# Patient Record
Sex: Female | Born: 1960 | Race: White | Hispanic: No | Marital: Single | Smoking: Former smoker
Health system: Southern US, Community
[De-identification: ages and names within clinical notes are randomized; demographics above are authoritative.]

## PROBLEM LIST (undated history)

## (undated) DIAGNOSIS — F419 Anxiety disorder, unspecified: Secondary | ICD-10-CM

## (undated) DIAGNOSIS — F329 Major depressive disorder, single episode, unspecified: Secondary | ICD-10-CM

## (undated) DIAGNOSIS — M1990 Unspecified osteoarthritis, unspecified site: Secondary | ICD-10-CM

## (undated) DIAGNOSIS — K219 Gastro-esophageal reflux disease without esophagitis: Secondary | ICD-10-CM

## (undated) DIAGNOSIS — F32A Depression, unspecified: Secondary | ICD-10-CM

## (undated) DIAGNOSIS — T4145XA Adverse effect of unspecified anesthetic, initial encounter: Secondary | ICD-10-CM

## (undated) DIAGNOSIS — T8859XA Other complications of anesthesia, initial encounter: Secondary | ICD-10-CM

## (undated) DIAGNOSIS — J45909 Unspecified asthma, uncomplicated: Secondary | ICD-10-CM

## (undated) DIAGNOSIS — R0600 Dyspnea, unspecified: Secondary | ICD-10-CM

## (undated) DIAGNOSIS — R000 Tachycardia, unspecified: Secondary | ICD-10-CM

## (undated) HISTORY — DX: Depression, unspecified: F32.A

## (undated) HISTORY — DX: Unspecified asthma, uncomplicated: J45.909

## (undated) HISTORY — DX: Major depressive disorder, single episode, unspecified: F32.9

## (undated) HISTORY — PX: ABDOMINAL HYSTERECTOMY: SHX81

## (undated) HISTORY — PX: INCONTINENCE SURGERY: SHX676

---

## 1990-08-08 HISTORY — PX: TUBAL LIGATION: SHX77

## 2006-03-29 ENCOUNTER — Ambulatory Visit: Payer: Self-pay | Admitting: Gastroenterology

## 2006-04-03 ENCOUNTER — Ambulatory Visit: Payer: Self-pay | Admitting: Unknown Physician Specialty

## 2007-04-20 ENCOUNTER — Ambulatory Visit: Payer: Self-pay | Admitting: Unknown Physician Specialty

## 2008-06-27 ENCOUNTER — Ambulatory Visit: Payer: Self-pay | Admitting: Unknown Physician Specialty

## 2008-08-21 ENCOUNTER — Ambulatory Visit: Payer: Self-pay | Admitting: Unknown Physician Specialty

## 2008-09-02 ENCOUNTER — Ambulatory Visit: Payer: Self-pay | Admitting: Unknown Physician Specialty

## 2009-07-06 ENCOUNTER — Ambulatory Visit: Payer: Self-pay | Admitting: Unknown Physician Specialty

## 2009-08-08 HISTORY — PX: BLADDER SURGERY: SHX569

## 2009-08-08 HISTORY — PX: HYSTERECTOMY ABDOMINAL WITH SALPINGECTOMY: SHX6725

## 2009-08-28 ENCOUNTER — Ambulatory Visit: Payer: Self-pay | Admitting: Obstetrics & Gynecology

## 2009-09-03 ENCOUNTER — Ambulatory Visit: Payer: Self-pay | Admitting: Obstetrics & Gynecology

## 2010-09-20 ENCOUNTER — Ambulatory Visit: Payer: Self-pay | Admitting: Obstetrics & Gynecology

## 2011-11-15 ENCOUNTER — Ambulatory Visit: Payer: Self-pay

## 2011-11-24 ENCOUNTER — Ambulatory Visit: Payer: Self-pay

## 2013-05-27 ENCOUNTER — Ambulatory Visit (INDEPENDENT_AMBULATORY_CARE_PROVIDER_SITE_OTHER): Payer: BC Managed Care – PPO | Admitting: Podiatry

## 2013-05-27 ENCOUNTER — Encounter: Payer: Self-pay | Admitting: Podiatry

## 2013-05-27 VITALS — BP 107/73 | HR 88 | Resp 16 | Ht 63.0 in | Wt 170.0 lb

## 2013-05-27 DIAGNOSIS — B351 Tinea unguium: Secondary | ICD-10-CM

## 2013-05-27 NOTE — Progress Notes (Signed)
N thick discolored  L right great  D this summer , June O all of sudden  C worse  A  T no treat ment     Salon pedicures .

## 2013-05-27 NOTE — Progress Notes (Signed)
Hannah Smith presents today as a 52 year old white female with concerns of onychomycosis to the hallux nail plates bilateral. She states has been there since the summer she says it appeared all of a sudden they become painful and she's had no treatment.  Objective: Vital signs are stable she is alert and oriented x3 I have reviewed her past medical history medications and allergies. Review of systems unremarkable. Bilateral lower extremity exam reveals strong palpable pulses bilateral. Neurologic Sorum is intact per since once the monofilament. Deep tendon reflexes are intact bilateral brisk and equal symmetrically. Muscle strength is 5 over 5 dorsiflexors plantar flexors inverters and evertors. Orthopedic evaluation should all joints distal to the ankle a full range of motion without crepitus. 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 Cutaneous evaluation does demonstrate thick dystrophic hallux nails bilaterally are thick dystrophic second digital nail plate right. At this point I think that the ridges in the hallux nails given away, in that I feel that this is more than likely a nail dystrophy rather than onychomycosis.  Assessment: Onychodystrophy rule out onychomycosis.  Plan: We performed a nail skin biopsy today from the hallux nails in skin surrounding. We're sending this off for biopsy. I will followup with her once systems in.

## 2013-05-31 ENCOUNTER — Emergency Department: Payer: Self-pay | Admitting: Emergency Medicine

## 2013-05-31 LAB — CBC
HCT: 40.7 % (ref 35.0–47.0)
Platelet: 239 10*3/uL (ref 150–440)
RBC: 4.75 10*6/uL (ref 3.80–5.20)
WBC: 7 10*3/uL (ref 3.6–11.0)

## 2013-05-31 LAB — BASIC METABOLIC PANEL
Anion Gap: 5 — ABNORMAL LOW (ref 7–16)
BUN: 9 mg/dL (ref 7–18)
Calcium, Total: 9.5 mg/dL (ref 8.5–10.1)
Co2: 30 mmol/L (ref 21–32)
Creatinine: 0.86 mg/dL (ref 0.60–1.30)
EGFR (African American): >60
EGFR (Non-African Amer.): >60
Osmolality: 276 (ref 275–301)
Potassium: 3.8 mmol/L (ref 3.5–5.1)
Sodium: 139 mmol/L (ref 136–145)

## 2013-06-14 ENCOUNTER — Telehealth: Payer: Self-pay | Admitting: Podiatry

## 2013-06-14 ENCOUNTER — Encounter: Payer: Self-pay | Admitting: Podiatry

## 2013-06-14 NOTE — Telephone Encounter (Signed)
Left message for patient to schedule an appointment for lab results.

## 2013-07-11 ENCOUNTER — Ambulatory Visit: Payer: BC Managed Care – PPO | Admitting: Podiatry

## 2013-12-05 ENCOUNTER — Ambulatory Visit: Payer: Self-pay | Admitting: Gastroenterology

## 2013-12-06 LAB — PATHOLOGY REPORT

## 2014-03-12 ENCOUNTER — Telehealth: Payer: Self-pay | Admitting: *Deleted

## 2014-03-12 NOTE — Telephone Encounter (Signed)
Patient called and stated that she never got a call regarding her lab results, i called patient back and told her i did leave her a message in November regarding her results she was to schedule an appointment for treatment . Patient understood and will call back to schedule, she cant afford the copay at this time

## 2014-05-26 ENCOUNTER — Telehealth: Payer: Self-pay | Admitting: *Deleted

## 2014-05-26 NOTE — Telephone Encounter (Signed)
Patient called asking if we could send over her results from her toenail fungus to her primary doctor, she does not have health insurance at this time . Will send results to dr Welton Flakeskhan

## 2015-03-05 ENCOUNTER — Ambulatory Visit: Payer: Self-pay | Admitting: Dietician

## 2015-03-24 ENCOUNTER — Encounter: Payer: 59 | Attending: Dietician | Admitting: Dietician

## 2015-03-24 ENCOUNTER — Encounter: Payer: Self-pay | Admitting: Dietician

## 2015-03-24 DIAGNOSIS — Z713 Dietary counseling and surveillance: Secondary | ICD-10-CM | POA: Diagnosis not present

## 2015-03-24 NOTE — Progress Notes (Signed)
Notes from [facility] employee "self referral" nutrition session: Start time: 13:15   End time: 14:00  Met with employee to discuss his/her nutritional concerns and diet history. The employee's questions/concerns were also addressed.  We discussed the following topics:  Healthy Eating  Weight Concerns  I also provided the following handouts as reinforcement of the educational session:  Planning a Balanced Meal  Sample menus and/or recipes   Additional Comments: Patient has made positive diet changes in the past 10 months and has lost approximately 10 lbs.She has increased her intake of fruits/vegetables, and fiber. Reviewed a meal plan and menus based on 1300 calories showing how to balance carbohydrate servings, protein and non-starchy vegetables.  Goals Agreed Upon: 1. Patient to spread 8 servings of carbohydrate over 3 meals and 1-2 small snacks.  2.To balance meals with 2-3 servings of carbohydrate, 1-3 oz.of lean protein.   3. To measure some portions especially of starchy foods.

## 2015-03-24 NOTE — Patient Instructions (Signed)
Goals Agreed Upon: 1. Patient to spread 8 servings of carbohydrate over 3 meals and 1-2 small snacks.  2.To balance meals with 2-3 servings of carbohydrate, 1-3 oz.of lean protein.   3. To measure some portions especially of starchy foods.

## 2015-08-14 DIAGNOSIS — J069 Acute upper respiratory infection, unspecified: Secondary | ICD-10-CM | POA: Diagnosis not present

## 2015-08-14 DIAGNOSIS — K219 Gastro-esophageal reflux disease without esophagitis: Secondary | ICD-10-CM | POA: Diagnosis not present

## 2015-08-14 DIAGNOSIS — J309 Allergic rhinitis, unspecified: Secondary | ICD-10-CM | POA: Diagnosis not present

## 2015-08-14 DIAGNOSIS — F411 Generalized anxiety disorder: Secondary | ICD-10-CM | POA: Diagnosis not present

## 2015-08-14 DIAGNOSIS — M7052 Other bursitis of knee, left knee: Secondary | ICD-10-CM | POA: Diagnosis not present

## 2015-09-22 DIAGNOSIS — M7052 Other bursitis of knee, left knee: Secondary | ICD-10-CM | POA: Diagnosis not present

## 2015-12-11 DIAGNOSIS — M545 Low back pain: Secondary | ICD-10-CM | POA: Diagnosis not present

## 2015-12-14 DIAGNOSIS — M5135 Other intervertebral disc degeneration, thoracolumbar region: Secondary | ICD-10-CM | POA: Diagnosis not present

## 2015-12-14 DIAGNOSIS — M5489 Other dorsalgia: Secondary | ICD-10-CM | POA: Diagnosis not present

## 2015-12-14 DIAGNOSIS — F411 Generalized anxiety disorder: Secondary | ICD-10-CM | POA: Diagnosis not present

## 2015-12-17 ENCOUNTER — Other Ambulatory Visit: Payer: Self-pay | Admitting: Internal Medicine

## 2015-12-17 DIAGNOSIS — M546 Pain in thoracic spine: Secondary | ICD-10-CM

## 2015-12-17 DIAGNOSIS — M549 Dorsalgia, unspecified: Secondary | ICD-10-CM

## 2015-12-28 DIAGNOSIS — R300 Dysuria: Secondary | ICD-10-CM | POA: Diagnosis not present

## 2015-12-28 DIAGNOSIS — F329 Major depressive disorder, single episode, unspecified: Secondary | ICD-10-CM | POA: Diagnosis not present

## 2015-12-31 DIAGNOSIS — M545 Low back pain: Secondary | ICD-10-CM | POA: Diagnosis not present

## 2016-01-12 ENCOUNTER — Other Ambulatory Visit: Payer: 59

## 2016-01-12 ENCOUNTER — Ambulatory Visit: Payer: 59

## 2016-03-07 DIAGNOSIS — F411 Generalized anxiety disorder: Secondary | ICD-10-CM | POA: Diagnosis not present

## 2016-04-07 DIAGNOSIS — Z0000 Encounter for general adult medical examination without abnormal findings: Secondary | ICD-10-CM | POA: Diagnosis not present

## 2016-04-07 DIAGNOSIS — K219 Gastro-esophageal reflux disease without esophagitis: Secondary | ICD-10-CM | POA: Diagnosis not present

## 2016-04-07 DIAGNOSIS — Z124 Encounter for screening for malignant neoplasm of cervix: Secondary | ICD-10-CM | POA: Diagnosis not present

## 2016-04-07 DIAGNOSIS — F411 Generalized anxiety disorder: Secondary | ICD-10-CM | POA: Diagnosis not present

## 2016-04-07 DIAGNOSIS — R0602 Shortness of breath: Secondary | ICD-10-CM | POA: Diagnosis not present

## 2016-04-07 DIAGNOSIS — J4520 Mild intermittent asthma, uncomplicated: Secondary | ICD-10-CM | POA: Diagnosis not present

## 2016-04-12 ENCOUNTER — Other Ambulatory Visit: Payer: Self-pay | Admitting: Internal Medicine

## 2016-04-12 DIAGNOSIS — Z1231 Encounter for screening mammogram for malignant neoplasm of breast: Secondary | ICD-10-CM

## 2016-04-26 ENCOUNTER — Ambulatory Visit
Admission: RE | Admit: 2016-04-26 | Discharge: 2016-04-26 | Disposition: A | Discharge status: Home / Self Care | Payer: 59 | Source: Ambulatory Visit | Attending: Internal Medicine | Admitting: Internal Medicine

## 2016-04-26 DIAGNOSIS — Z1231 Encounter for screening mammogram for malignant neoplasm of breast: Secondary | ICD-10-CM | POA: Diagnosis not present

## 2016-04-26 DIAGNOSIS — R29890 Loss of height: Secondary | ICD-10-CM | POA: Diagnosis not present

## 2016-04-26 DIAGNOSIS — N951 Menopausal and female climacteric states: Secondary | ICD-10-CM | POA: Diagnosis not present

## 2016-04-26 DIAGNOSIS — R0602 Shortness of breath: Secondary | ICD-10-CM | POA: Diagnosis not present

## 2016-04-28 ENCOUNTER — Ambulatory Visit: Payer: 59

## 2016-05-02 DIAGNOSIS — J309 Allergic rhinitis, unspecified: Secondary | ICD-10-CM | POA: Diagnosis not present

## 2016-05-02 DIAGNOSIS — I313 Pericardial effusion (noninflammatory): Secondary | ICD-10-CM | POA: Diagnosis not present

## 2016-05-02 DIAGNOSIS — J069 Acute upper respiratory infection, unspecified: Secondary | ICD-10-CM | POA: Diagnosis not present

## 2016-05-02 DIAGNOSIS — J45998 Other asthma: Secondary | ICD-10-CM | POA: Diagnosis not present

## 2016-05-02 DIAGNOSIS — R9431 Abnormal electrocardiogram [ECG] [EKG]: Secondary | ICD-10-CM | POA: Diagnosis not present

## 2016-05-02 DIAGNOSIS — R0602 Shortness of breath: Secondary | ICD-10-CM | POA: Diagnosis not present

## 2016-05-02 DIAGNOSIS — R0782 Intercostal pain: Secondary | ICD-10-CM | POA: Diagnosis not present

## 2016-05-09 DIAGNOSIS — R079 Chest pain, unspecified: Secondary | ICD-10-CM | POA: Diagnosis not present

## 2016-05-12 DIAGNOSIS — R5383 Other fatigue: Secondary | ICD-10-CM | POA: Diagnosis not present

## 2016-05-12 DIAGNOSIS — R9431 Abnormal electrocardiogram [ECG] [EKG]: Secondary | ICD-10-CM | POA: Diagnosis not present

## 2016-05-26 DIAGNOSIS — R0782 Intercostal pain: Secondary | ICD-10-CM | POA: Diagnosis not present

## 2016-05-26 DIAGNOSIS — R002 Palpitations: Secondary | ICD-10-CM | POA: Diagnosis not present

## 2016-05-26 DIAGNOSIS — R0602 Shortness of breath: Secondary | ICD-10-CM | POA: Diagnosis not present

## 2016-06-01 ENCOUNTER — Emergency Department: Payer: 59

## 2016-06-01 ENCOUNTER — Emergency Department
Admission: EM | Admit: 2016-06-01 | Discharge: 2016-06-02 | Disposition: A | Discharge status: Home / Self Care | Payer: 59 | Attending: Emergency Medicine | Admitting: Emergency Medicine

## 2016-06-01 ENCOUNTER — Encounter: Payer: Self-pay | Admitting: Emergency Medicine

## 2016-06-01 DIAGNOSIS — Z7982 Long term (current) use of aspirin: Secondary | ICD-10-CM | POA: Diagnosis not present

## 2016-06-01 DIAGNOSIS — J45909 Unspecified asthma, uncomplicated: Secondary | ICD-10-CM | POA: Diagnosis not present

## 2016-06-01 DIAGNOSIS — Z79899 Other long term (current) drug therapy: Secondary | ICD-10-CM | POA: Insufficient documentation

## 2016-06-01 DIAGNOSIS — Z91040 Latex allergy status: Secondary | ICD-10-CM | POA: Diagnosis not present

## 2016-06-01 DIAGNOSIS — K8020 Calculus of gallbladder without cholecystitis without obstruction: Secondary | ICD-10-CM | POA: Diagnosis not present

## 2016-06-01 DIAGNOSIS — R1011 Right upper quadrant pain: Secondary | ICD-10-CM | POA: Diagnosis not present

## 2016-06-01 DIAGNOSIS — R110 Nausea: Secondary | ICD-10-CM

## 2016-06-01 DIAGNOSIS — Z87891 Personal history of nicotine dependence: Secondary | ICD-10-CM | POA: Insufficient documentation

## 2016-06-01 HISTORY — DX: Unspecified osteoarthritis, unspecified site: M19.90

## 2016-06-01 HISTORY — DX: Tachycardia, unspecified: R00.0

## 2016-06-01 LAB — COMPREHENSIVE METABOLIC PANEL
ALBUMIN: 3.9 g/dL (ref 3.5–5.0)
ALK PHOS: 64 U/L (ref 38–126)
ALT: 29 U/L (ref 14–54)
ANION GAP: 8 (ref 5–15)
AST: 23 U/L (ref 15–41)
BILIRUBIN TOTAL: 0.3 mg/dL (ref 0.3–1.2)
BUN: 15 mg/dL (ref 6–20)
CALCIUM: 9.2 mg/dL (ref 8.9–10.3)
CO2: 26 mmol/L (ref 22–32)
Chloride: 103 mmol/L (ref 101–111)
Creatinine, Ser: 0.9 mg/dL (ref 0.44–1.00)
GFR calc Af Amer: >60 mL/min (ref >60)
GLUCOSE: 99 mg/dL (ref 65–99)
Potassium: 3.9 mmol/L (ref 3.5–5.1)
Sodium: 137 mmol/L (ref 135–145)
TOTAL PROTEIN: 7.2 g/dL (ref 6.5–8.1)

## 2016-06-01 LAB — CBC
HCT: 41 % (ref 35.0–47.0)
HEMOGLOBIN: 13.7 g/dL (ref 12.0–16.0)
MCH: 28.6 pg (ref 26.0–34.0)
MCHC: 33.5 g/dL (ref 32.0–36.0)
MCV: 85.4 fL (ref 80.0–100.0)
Platelets: 248 10*3/uL (ref 150–440)
RBC: 4.79 MIL/uL (ref 3.80–5.20)
RDW: 13.4 % (ref 11.5–14.5)
WBC: 9.3 10*3/uL (ref 3.6–11.0)

## 2016-06-01 LAB — URINALYSIS COMPLETE WITH MICROSCOPIC (ARMC ONLY)
Bacteria, UA: NONE SEEN
Bilirubin Urine: NEGATIVE
GLUCOSE, UA: NEGATIVE mg/dL
KETONES UR: NEGATIVE mg/dL
Leukocytes, UA: NEGATIVE
NITRITE: NEGATIVE
Protein, ur: NEGATIVE mg/dL
RBC / HPF: NONE SEEN RBC/hpf (ref 0–5)
SPECIFIC GRAVITY, URINE: 1.01 (ref 1.005–1.030)
Squamous Epithelial / LPF: NONE SEEN
pH: 6 (ref 5.0–8.0)

## 2016-06-01 LAB — LIPASE, BLOOD: LIPASE: 41 U/L (ref 11–51)

## 2016-06-01 MED ORDER — ONDANSETRON HCL 4 MG/2ML IJ SOLN
4.0000 mg | Freq: Once | INTRAMUSCULAR | Status: AC
  Administered 2016-06-01: 4 mg via INTRAVENOUS
  Filled 2016-06-01: qty 2

## 2016-06-01 MED ORDER — OXYCODONE-ACETAMINOPHEN 5-325 MG PO TABS: 1.0000 | ORAL_TABLET | Freq: Four times a day (QID) | ORAL | 0 refills | Status: DC | PRN

## 2016-06-01 MED ORDER — ONDANSETRON 4 MG PO TBDP: 4.0000 mg | ORAL_TABLET | Freq: Three times a day (TID) | ORAL | 0 refills | Status: DC | PRN

## 2016-06-01 MED ORDER — SODIUM CHLORIDE 0.9 % IV BOLUS (SEPSIS)
1000.0000 mL | Freq: Once | INTRAVENOUS | Status: AC
  Administered 2016-06-01: 1000 mL via INTRAVENOUS

## 2016-06-01 MED ORDER — ACETAMINOPHEN 500 MG PO TABS
1000.0000 mg | ORAL_TABLET | Freq: Once | ORAL | Status: AC
  Administered 2016-06-01: 1000 mg via ORAL
  Filled 2016-06-01: qty 2

## 2016-06-01 NOTE — ED Provider Notes (Signed)
[facility] Emergency Department Provider Note  ____________________________________________  Time seen: Approximately 9:52 PM  I have reviewed the triage vital signs and the nursing notes.   HISTORY  Chief Complaint Abdominal Pain    HPI Hannah Smith is a 55 y.o. female s/p abdominal hysterectomy and incontinence surgery presenting with right upper quadrant pain, nausea without vomiting. The patient reports that last night after dinner she developed a dull ache in the right upper quadrant. This point she had some mild nausea which resolved. This evening, immediately after eating dinner, she developed acute intermittent episodes of worsening severe cramping "like labor" in the right upper quadrant. She denies any nausea or vomiting tonight, diarrhea, urinary symptoms, fever.She tried Tums, Pepto-Bismol, and ginger ale without improvement.   Past Medical History:  Diagnosis Date  . Arthritis    bilateral hands  . Asthma   . Depression   . Tachycardia     There are no active problems to display for this patient.   Past Surgical History:  Procedure Laterality Date  . ABDOMINAL HYSTERECTOMY    . INCONTINENCE SURGERY      Current Outpatient Rx  . Order #: [id number] Class: Historical Med  . Order #: [id number] Class: Historical Med  . Order #: [id number] Class: Historical Med  . Order #: [id number] Class: Historical Med  . Order #: [id number] Class: Historical Med  . Order #: [id number] Class: Historical Med  . Order #: [id number] Class: Historical Med  . Order #: [id number] Class: Historical Med  . Order #: [id number] Class: Print  . Order #: [id number] Class: Print    Allergies Latex  No family history on file.  Social History Social History  Substance Use Topics  . Smoking status: Former Games developer  . Smokeless tobacco: Never Used     Comment: quit 5 years ago  . Alcohol use No    Review of Systems Constitutional: No fever/chills.No lightheadedness or  fainting. Eyes: No visual changes. ENT: No sore throat. No congestion or rhinorrhea. Cardiovascular: Denies chest pain. Denies palpitations. Respiratory: Denies shortness of breath.  No cough. Gastrointestinal: Positive right upper quadrant abdominal pain.  Positive nausea, no vomiting.  No diarrhea.  No constipation. Genitourinary: Negative for dysuria. Musculoskeletal: Negative for back pain. Skin: Negative for rash. Neurological: Negative for headaches. No focal numbness, tingling or weakness.   10-point ROS otherwise negative.  ____________________________________________   PHYSICAL EXAM:  VITAL SIGNS: ED Triage Vitals [06/01/16 2100]  Enc Vitals Group     BP (!) 142/86     Pulse Rate 92     Resp 18     Temp 97.9 F (36.6 C)     Temp Source Oral     SpO2 97 %     Weight 167 lb (75.8 kg)     Height 5\' 3"  (1.6 m)     Head Circumference      Peak Flow      Pain Score 6     Pain Loc      Pain Edu?      Excl. in GC?     Constitutional: Alert and oriented. Well appearing and in no acute distress. Answers questions appropriately. Eyes: Conjunctivae are normal.  EOMI. No scleral icterus. Head: Atraumatic. Nose: No congestion/rhinnorhea. Mouth/Throat: Mucous membranes are moist.  Neck: No stridor.  Supple.  No meningismus. Cardiovascular: Normal rate, regular rhythm. No murmurs, rubs or gallops.  Respiratory: Normal respiratory effort.  No accessory muscle use or retractions. Lungs CTAB.  No wheezes, rales or ronchi. Gastrointestinal: Overweight. Soft, and  nondistended. Positive tenderness to palpation in the right upper quadrant without Murphy sign. No guarding or rebound.  No peritoneal signs. Musculoskeletal: No LE edema.  Neurologic:  A&Ox3.  Speech is clear.  Face and smile are symmetric.  EOMI.  Moves all extremities well. Skin:  Skin is warm, dry and intact. No rash noted. Psychiatric: Mood and affect are normal. Speech and behavior are normal.  Normal  judgement.  ____________________________________________   LABS (all labs ordered are listed, but only abnormal results are displayed)  Labs Reviewed  URINALYSIS COMPLETEWITH MICROSCOPIC ([facility] ONLY) - Abnormal; Notable for the following:       Result Value   Color, Urine STRAW (*)    APPearance CLEAR (*)    Hgb urine dipstick 1+ (*)    All other components within normal limits  LIPASE, BLOOD  COMPREHENSIVE METABOLIC PANEL  CBC   ____________________________________________  EKG  ED ECG REPORT I, Rockne Menghini, the attending physician, personally viewed and interpreted this ECG.   Date: 06/01/2016  EKG Time: 2113  Rate: 81  Rhythm: normal sinus rhythm  Axis: normal  Intervals:none  ST&T Change: No STEMI.  ____________________________________________  RADIOLOGY  US Abdomen Limited Ruq  Result Date: 06/01/2016 CLINICAL DATA:  Right upper quadrant pain EXAM: US ABDOMEN LIMITED - RIGHT UPPER QUADRANT COMPARISON:  None. FINDINGS: Gallbladder: The gallbladder is distended in appearance with several calculi along the dependent wall. The largest is seen near the neck of the gallbladder measuring approximately 9 mm. Sonographic Murphy's sign was noted by the technologist. No wall thickening or pericholecystic fluid. Common bile duct: Diameter: 4 mm Liver: No focal lesion identified. Within normal limits in parenchymal echogenicity. IMPRESSION: Distended gallbladder with gallstones and positive sonographic Murphy's sign. Acute cholecystitis is suspected. Electronically Signed   By: Tollie Eth M.D.   On: 06/01/2016 23:40    ____________________________________________   PROCEDURES  Procedure(s) performed: None  Procedures  Critical Care performed: No ____________________________________________   INITIAL IMPRESSION / ASSESSMENT AND PLAN / ED COURSE  Pertinent labs & imaging results that were available during my care of the patient were reviewed by me and  considered in my medical decision making (see chart for details).  55 y.o. female presenting with right upper quadrant pain that is worse with eating, associated nausea without vomiting. Overall, the patient is well-appearing comfortable, and afebrile. She does have some right upper quadrant tenderness. I am concerned about gallbladder disease, I would also consider GERD, peptic ulcer disease or gastric ulcer disease. Her history and physical examination are not consistent with cardiac pathology [facility] pathology. Her EKG does not show any ischemic changes. We'll get basic labs, and ultrasound of the right upper quadrant, and initiated symptomatically treatment.  ----------------------------------------- 11:50 PM on 06/01/2016 -----------------------------------------  The patient is feeling much better at this time. She continues to be hemodynamically stable and afebrile. She does not have an elevation in her white blood cell count and her LFTs are normal. Her ultrasound shows gallstones without thickening of the duct, pericholecystic fluid or thickening of the gallbladder wall. I had Dr. Tonita Cong reviewed the patient's laboratory and ultrasonographic findings, and we both agree that the patient does not meet criteria for cholecystitis or acute emergent cholecystectomy. She will follow up as an outpatient with Dr. Tonita Cong. I will plan to discharge her home with symptom at a treatment, dietary recommendations, and follow-up. Return precautions were discussed. ____________________________________________  FINAL CLINICAL IMPRESSION(S) / ED DIAGNOSES  Final diagnoses:  Right upper quadrant pain  Gallstones  Nausea without vomiting    Clinical Course      NEW MEDICATIONS STARTED DURING THIS VISIT:  New Prescriptions   ONDANSETRON (ZOFRAN ODT) 4 MG DISINTEGRATING TABLET    Take 1 tablet (4 mg total) by mouth every 8 (eight) hours as needed for nausea or vomiting.   OXYCODONE-ACETAMINOPHEN  (ROXICET) 5-325 MG TABLET    Take 1 tablet by mouth every 6 (six) hours as needed.      Rockne MenghiniAnne-Caroline Krystal Teachey, MD 06/01/16 2351

## 2016-06-01 NOTE — ED Notes (Signed)
Patient transported to Ultrasound 

## 2016-06-01 NOTE — Discharge Instructions (Signed)
Please follow the dietary recommendations for gallbladder conditions. You may take Zofran for nausea and Percocet for severe pain. Please make an appointment with Dr. Tonita CongWoodham, the general surgeon, for evaluation of your gallbladder pain.  Return to the emergency department if you develop severe pain, fever, inability to keep down fluids, or any other symptoms concerning to you.

## 2016-06-01 NOTE — ED Triage Notes (Signed)
Pt ambulatory to triage with steady gait with c/o RUQ accompanied by nasuea since last night after eating dinner and tonight again after eating dinner. Pt reports feeling gas pains. Pt denies diarrhea, vomiting, or chest pain. Pt alert and oriented x 4.

## 2016-06-02 ENCOUNTER — Other Ambulatory Visit: Payer: Self-pay

## 2016-06-02 DIAGNOSIS — F32A Depression, unspecified: Secondary | ICD-10-CM | POA: Insufficient documentation

## 2016-06-02 DIAGNOSIS — M1990 Unspecified osteoarthritis, unspecified site: Secondary | ICD-10-CM | POA: Insufficient documentation

## 2016-06-02 DIAGNOSIS — F329 Major depressive disorder, single episode, unspecified: Secondary | ICD-10-CM | POA: Insufficient documentation

## 2016-06-02 DIAGNOSIS — T7840XA Allergy, unspecified, initial encounter: Secondary | ICD-10-CM | POA: Insufficient documentation

## 2016-06-02 DIAGNOSIS — IMO0002 Reserved for concepts with insufficient information to code with codable children: Secondary | ICD-10-CM | POA: Insufficient documentation

## 2016-06-08 ENCOUNTER — Ambulatory Visit (INDEPENDENT_AMBULATORY_CARE_PROVIDER_SITE_OTHER): Payer: 59 | Admitting: General Surgery

## 2016-06-08 ENCOUNTER — Encounter: Payer: Self-pay | Admitting: General Surgery

## 2016-06-08 VITALS — BP 121/82 | HR 77 | Temp 98.0°F | Ht 63.0 in | Wt 172.0 lb

## 2016-06-08 DIAGNOSIS — K8020 Calculus of gallbladder without cholecystitis without obstruction: Secondary | ICD-10-CM | POA: Diagnosis not present

## 2016-06-08 NOTE — Patient Instructions (Addendum)
  We have your surgery scheduled for 06/24/16 with Dr.Woodham at [facility]. Please see [facility] pre-care sheet for surgery information. Please call our office if you have any questions or concerns.

## 2016-06-08 NOTE — Progress Notes (Signed)
Patient ID: Hannah Smith, female   DOB: [birth year], 55 y.o.   MRN: [id number]  CC: RUQ PAIN  HPI Hannah FellingJennifer A Smith is a 55 y.o. female who presents to clinic today for evaluation of right upper quadrant pain. Patient states he was seen in the ER last week and was diagnosed with gallstones. Last week which her first ever attack. She reports since then she has had some persistent discomfort in the right upper quadrant but not as bad as that time. She has had nausea with the attacks but no vomiting. She also states that after tach she's had some loose stools and some belching but denies any fevers, chills, chest pain, shortness of breath, vomiting, constipation. When she has the pain she says it is a sharp pain in her right upper quadrant but does not shoot through her. She also developed headaches with it. She has had a decrease in appetite secondary to the pain because she is afraid to eat thinking that it might cause the pain. She also has a history of depression and is currently on medications for such. She is otherwise in her usual state of health. 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  HPI  Past Medical History:  Diagnosis Date  . Arthritis    bilateral hands  . Asthma   . Depression   . Tachycardia     Past Surgical History:  Procedure Laterality Date  . ABDOMINAL HYSTERECTOMY    . BLADDER SURGERY  2011  . HYSTERECTOMY ABDOMINAL WITH SALPINGECTOMY  2011  . INCONTINENCE SURGERY    . TUBAL LIGATION  1992    Family History  Problem Relation Age of Onset  . Hypertension Mother   . Stroke Mother     Social History Social History  Substance Use Topics  . Smoking status: Former Games developermoker  . Smokeless tobacco: Never Used     Comment: quit 5 years ago  . Alcohol use No    Allergies  Allergen Reactions  . Latex Shortness Of Breath    Current Outpatient Prescriptions  Medication Sig Dispense Refill  . albuterol (PROVENTIL) (5 MG/ML) 0.5% nebulizer solution Take  2.5 mg by nebulization as needed for wheezing.    . citalopram (CELEXA) 40 MG tablet Take 40 mg by mouth daily.    . cyclobenzaprine (FLEXERIL) 5 MG tablet Take 10 mg by mouth at bedtime.     . DULoxetine (CYMBALTA) 30 MG capsule Take 30 mg by mouth daily.    Marland Kitchen. esomeprazole (NEXIUM) 40 MG capsule Take 40 mg by mouth daily at 12 noon.    . fluticasone (FLONASE) 50 MCG/ACT nasal spray by Nasal route.    . fluticasone-salmeterol (ADVAIR HFA) 230-21 MCG/ACT inhaler Inhale 2 puffs into the lungs as needed.    . meloxicam (MOBIC) 15 MG tablet Take by mouth.    . metoprolol-hydrochlorothiazide (LOPRESSOR HCT) 100-25 MG tablet Take 1 tablet by mouth daily.    . ondansetron (ZOFRAN ODT) 4 MG disintegrating tablet Take 1 tablet (4 mg total) by mouth every 8 (eight) hours as needed for nausea or vomiting. 20 tablet 0  . oxyCODONE-acetaminophen (ROXICET) 5-325 MG tablet Take 1 tablet by mouth every 6 (six) hours as needed. 12 tablet 0   No current facility-administered medications for this visit.      Review of Systems A Multi-point review of systems was asked and was negative except for the findings documented in the history of present illness  Physical Exam Blood pressure  121/82, pulse 77, temperature 98 F (36.7 C), temperature source Oral, height 5\' 3"  (1.6 m), weight 78 kg (172 lb). CONSTITUTIONAL: No acute distress. EYES: Pupils are equal, round, and reactive to light, Sclera are non-icteric. EARS, NOSE, MOUTH AND THROAT: The oropharynx is clear. The oral mucosa is pink and moist. Hearing is intact to voice. LYMPH NODES:  Lymph nodes in the neck are normal. RESPIRATORY:  Lungs are clear. There is normal respiratory effort, with equal breath sounds bilaterally, and without pathologic use of accessory muscles. CARDIOVASCULAR: Heart is regular without murmurs, gallops, or rubs. GI: The abdomen is soft, nontender, and nondistended. There are no palpable masses. There is no hepatosplenomegaly. There  are normal bowel sounds in all quadrants. GU: Rectal deferred.   MUSCULOSKELETAL: Normal muscle strength and tone. No cyanosis or edema.   SKIN: Turgor is good and there are no pathologic skin lesions or ulcers. NEUROLOGIC: Motor and sensation is grossly normal. Cranial nerves are grossly intact. PSYCH:  Oriented to person, place and time. Affect is normal.  Data Reviewed Images and labs reviewed. Labs performed in the ER her all within normal limits with a white blood cell count 9.3, total bilirubin 0.3, a LT 29, AST 23 the remainder are all normal. Ultrasound the gallbladder does show gallstones, common bile duct with normal 4 mm, no evidence of gall or wall thickening or pericholecystic fluid. I have personally reviewed the patient's imaging, laboratory findings and medical records.    Assessment    Symptomatic cholelithiasis    Plan    55 year old female with symptomatic cholelithiasis. Discussed the diagnosis in detail as well as the treatment being a laparoscopic cholecystectomy. I discussed the procedure in detail.  The patient was given Agricultural engineereducational material.  We discussed the risks and benefits of a laparoscopic cholecystectomy and possible cholangiogram including, but not limited to bleeding, infection, injury to surrounding structures such as the intestine or liver, bile leak, retained gallstones, need to convert to an open procedure, prolonged diarrhea, blood clots such as  DVT, common bile duct injury, anesthesia risks, and possible need for additional procedures.  The likelihood of improvement in symptoms and return to the patient's normal status is good. We discussed the typical post-operative recovery course. Patient voiced understanding and a desire to proceed. We will plan with surgery on Friday, November 17.      Time spent with the patient was 45 minutes, with more than 50% of the time spent in face-to-face education, counseling and care coordination.     Ricarda Frameharles Drayce Tawil,  MD FACS General Surgeon 06/08/2016, 2:03 PM

## 2016-06-09 ENCOUNTER — Telehealth: Payer: Self-pay | Admitting: General Surgery

## 2016-06-09 NOTE — Telephone Encounter (Signed)
Pt advised of pre op date/time and sx date. Sx: 06/24/16 with Dr Devoria AlbeWoodham--laparoscopic cholecystectomy.  Pre op: 06/16/16 @ 7:45am--Office.   Patient made aware to call [phone], between 1-3:00pm the day before surgery, to find out what time to arrive.    Physician estimate: 191.51 Deductible: 0.00 Co insurance: 20%.

## 2016-06-14 ENCOUNTER — Telehealth: Payer: Self-pay | Admitting: General Surgery

## 2016-06-14 NOTE — Telephone Encounter (Signed)
Can you call her Friday regarding FMLA paperwork. She is a little confused on the process.  Surgery is 11/17

## 2016-06-16 ENCOUNTER — Other Ambulatory Visit: Payer: 59

## 2016-06-17 NOTE — Telephone Encounter (Signed)
Called patient back and had to leave a voicemail.  I called patient back today per patient's request.

## 2016-06-17 NOTE — Telephone Encounter (Signed)
Patient called me back and I explained the process of how I feel out disability forms until after her surgery and then I fax it. If they need additional information, then they will send me an additional form or they will call me. Patient understood and had no further questions.

## 2016-06-20 ENCOUNTER — Ambulatory Visit
Admission: RE | Admit: 2016-06-20 | Discharge: 2016-06-20 | Disposition: A | Discharge status: Home / Self Care | Payer: 59 | Source: Ambulatory Visit | Attending: General Surgery | Admitting: General Surgery

## 2016-06-20 ENCOUNTER — Encounter
Admission: RE | Admit: 2016-06-20 | Discharge: 2016-06-20 | Disposition: A | Discharge status: Home / Self Care | Payer: 59 | Source: Ambulatory Visit | Attending: General Surgery | Admitting: General Surgery

## 2016-06-20 ENCOUNTER — Other Ambulatory Visit: Payer: 59

## 2016-06-20 DIAGNOSIS — Z01810 Encounter for preprocedural cardiovascular examination: Secondary | ICD-10-CM | POA: Diagnosis not present

## 2016-06-20 DIAGNOSIS — Z01818 Encounter for other preprocedural examination: Secondary | ICD-10-CM | POA: Diagnosis not present

## 2016-06-20 HISTORY — DX: Anxiety disorder, unspecified: F41.9

## 2016-06-20 HISTORY — DX: Adverse effect of unspecified anesthetic, initial encounter: T41.45XA

## 2016-06-20 HISTORY — DX: Dyspnea, unspecified: R06.00

## 2016-06-20 HISTORY — DX: Other complications of anesthesia, initial encounter: T88.59XA

## 2016-06-20 HISTORY — DX: Gastro-esophageal reflux disease without esophagitis: K21.9

## 2016-06-20 NOTE — Patient Instructions (Signed)
  Your procedure is scheduled on:June 24, 2016 (Friday) Report to Same Day Surgery 2nd floor medical mall To find out your arrival time please call [phone] between 1PM - 3PM on June 23, 2016 (Thursday)  Remember: Instructions that are not followed completely may result in serious medical risk, up to and including death, or upon the discretion of your surgeon and anesthesiologist your surgery may need to be rescheduled.    _x___ 1. Do not eat food or drink liquids after midnight. No gum chewing or hard candies.     __x__ 2. No Alcohol for 24 hours before or after surgery.   __x__3. No Smoking for 24 prior to surgery.   ____  4. Bring all medications with you on the day of surgery if instructed.    __x__ 5. Notify your doctor if there is any change in your medical condition     (cold, fever, infections).     Do not wear jewelry, make-up, hairpins, clips or nail polish.  Do not wear lotions, powders, or perfumes. You may wear deodorant.  Do not shave 48 hours prior to surgery. Men may shave face and neck.  Do not bring valuables to the hospital.    [facility] is not responsible for any belongings or valuables.               Contacts, dentures or bridgework may not be worn into surgery.  Leave your suitcase in the car. After surgery it may be brought to your room.  For patients admitted to the hospital, discharge time is determined by your treatment team.   Patients discharged the day of surgery will not be allowed to drive home.    Please read over the following fact sheets that you were given:   [facility] Preparing for Surgery and or MRSA Information   _x___ Take these medicines the morning of surgery with A SIP OF WATER:    1. Nexium (Nexium at bedtime on November 16)  2. May take Cymbalta the morning of surgery if needed   3. Metoprolol  ____Fleets enema or Magnesium Citrate as directed.   _x___ Use CHG Soap or sage wipes as directed on instruction sheet    __x_ Use inhalers on the day of surgery and bring to hospital day of surgery (Use Albuterol inhaler the morning of surgery and bring to hospital)  ____ Stop metformin 2 days prior to surgery    ____ Take 1/2 of usual insulin dose the night before surgery and none on the morning of surgery.           ____ Stop aspirin or coumadin, or plavix  x__ Stop Anti-inflammatories such as Advil, Aleve, Ibuprofen, Motrin, Naproxen,          Naprosyn, Goodies powders or aspirin products. (Stop Meloxicam now)  Ok to take Tylenol.   ____ Stop supplements until after surgery.    ____ Bring C-Pap to the hospital.

## 2016-06-23 MED ORDER — CEFOXITIN SODIUM 1 G IV SOLR
1.0000 g | INTRAVENOUS | Status: AC
  Administered 2016-06-24: 1 g via INTRAVENOUS
  Filled 2016-06-23: qty 1

## 2016-06-24 ENCOUNTER — Encounter
Admission: RE | Disposition: A | Discharge status: Home / Self Care | Payer: Self-pay | Source: Ambulatory Visit | Attending: General Surgery

## 2016-06-24 ENCOUNTER — Ambulatory Visit: Payer: 59 | Admitting: Anesthesiology

## 2016-06-24 ENCOUNTER — Encounter: Payer: Self-pay | Admitting: *Deleted

## 2016-06-24 ENCOUNTER — Ambulatory Visit
Admission: RE | Admit: 2016-06-24 | Discharge: 2016-06-24 | Disposition: A | Discharge status: Home / Self Care | Payer: 59 | Source: Ambulatory Visit | Attending: General Surgery | Admitting: General Surgery

## 2016-06-24 DIAGNOSIS — M19041 Primary osteoarthritis, right hand: Secondary | ICD-10-CM | POA: Diagnosis not present

## 2016-06-24 DIAGNOSIS — Z90710 Acquired absence of both cervix and uterus: Secondary | ICD-10-CM | POA: Diagnosis not present

## 2016-06-24 DIAGNOSIS — K8020 Calculus of gallbladder without cholecystitis without obstruction: Secondary | ICD-10-CM | POA: Diagnosis not present

## 2016-06-24 DIAGNOSIS — Z91040 Latex allergy status: Secondary | ICD-10-CM | POA: Insufficient documentation

## 2016-06-24 DIAGNOSIS — Z8249 Family history of ischemic heart disease and other diseases of the circulatory system: Secondary | ICD-10-CM | POA: Insufficient documentation

## 2016-06-24 DIAGNOSIS — F419 Anxiety disorder, unspecified: Secondary | ICD-10-CM | POA: Insufficient documentation

## 2016-06-24 DIAGNOSIS — Z823 Family history of stroke: Secondary | ICD-10-CM | POA: Insufficient documentation

## 2016-06-24 DIAGNOSIS — J45909 Unspecified asthma, uncomplicated: Secondary | ICD-10-CM | POA: Diagnosis not present

## 2016-06-24 DIAGNOSIS — M19042 Primary osteoarthritis, left hand: Secondary | ICD-10-CM | POA: Diagnosis not present

## 2016-06-24 DIAGNOSIS — K219 Gastro-esophageal reflux disease without esophagitis: Secondary | ICD-10-CM | POA: Diagnosis not present

## 2016-06-24 DIAGNOSIS — K8010 Calculus of gallbladder with chronic cholecystitis without obstruction: Secondary | ICD-10-CM | POA: Diagnosis not present

## 2016-06-24 DIAGNOSIS — F329 Major depressive disorder, single episode, unspecified: Secondary | ICD-10-CM | POA: Insufficient documentation

## 2016-06-24 HISTORY — PX: CHOLECYSTECTOMY: SHX55

## 2016-06-24 SURGERY — LAPAROSCOPIC CHOLECYSTECTOMY
Anesthesia: General | Wound class: Clean Contaminated

## 2016-06-24 MED ORDER — ROCURONIUM BROMIDE 100 MG/10ML IV SOLN
INTRAVENOUS | Status: DC | PRN
  Administered 2016-06-24: 10 mg via INTRAVENOUS
  Administered 2016-06-24: 30 mg via INTRAVENOUS

## 2016-06-24 MED ORDER — FENTANYL CITRATE (PF) 100 MCG/2ML IJ SOLN
INTRAMUSCULAR | Status: DC
Start: 2016-06-24 — End: 2016-06-24
  Filled 2016-06-24: qty 2

## 2016-06-24 MED ORDER — ONDANSETRON HCL 4 MG/2ML IJ SOLN: 4.0000 mg | Freq: Once | INTRAMUSCULAR | Status: DC | PRN

## 2016-06-24 MED ORDER — CHLORHEXIDINE GLUCONATE CLOTH 2 % EX PADS: 6.0000 | MEDICATED_PAD | Freq: Once | CUTANEOUS | Status: DC

## 2016-06-24 MED ORDER — FENTANYL CITRATE (PF) 100 MCG/2ML IJ SOLN
INTRAMUSCULAR | Status: DC | PRN
  Administered 2016-06-24: 100 ug via INTRAVENOUS

## 2016-06-24 MED ORDER — ACETAMINOPHEN 10 MG/ML IV SOLN
INTRAVENOUS | Status: AC
  Filled 2016-06-24: qty 100

## 2016-06-24 MED ORDER — KETOROLAC TROMETHAMINE 30 MG/ML IJ SOLN
INTRAMUSCULAR | Status: DC | PRN
  Administered 2016-06-24: 30 mg via INTRAVENOUS

## 2016-06-24 MED ORDER — ACETAMINOPHEN 10 MG/ML IV SOLN
INTRAVENOUS | Status: DC | PRN
  Administered 2016-06-24: 1000 mg via INTRAVENOUS

## 2016-06-24 MED ORDER — LACTATED RINGERS IV SOLN
INTRAVENOUS | Status: DC
  Administered 2016-06-24 (×3): via INTRAVENOUS

## 2016-06-24 MED ORDER — IPRATROPIUM-ALBUTEROL 0.5-2.5 (3) MG/3ML IN SOLN
3.0000 mL | RESPIRATORY_TRACT | Status: DC
  Administered 2016-06-24: 3 mL via RESPIRATORY_TRACT

## 2016-06-24 MED ORDER — LIDOCAINE HCL (PF) 1 % IJ SOLN
INTRAMUSCULAR | Status: AC
  Filled 2016-06-24: qty 30

## 2016-06-24 MED ORDER — HYDROCODONE-ACETAMINOPHEN 5-325 MG PO TABS: 1.0000 | ORAL_TABLET | Freq: Four times a day (QID) | ORAL | 0 refills | Status: DC | PRN

## 2016-06-24 MED ORDER — FENTANYL CITRATE (PF) 100 MCG/2ML IJ SOLN
25.0000 ug | INTRAMUSCULAR | Status: DC | PRN
  Administered 2016-06-24 (×4): 25 ug via INTRAVENOUS

## 2016-06-24 MED ORDER — LIDOCAINE HCL 1 % IJ SOLN
INTRAMUSCULAR | Status: DC | PRN
  Administered 2016-06-24: 26 mL

## 2016-06-24 MED ORDER — MIDAZOLAM HCL 2 MG/2ML IJ SOLN
INTRAMUSCULAR | Status: DC | PRN
Start: 2016-06-24 — End: 2016-06-24
  Administered 2016-06-24: 2 mg via INTRAVENOUS

## 2016-06-24 MED ORDER — OXYCODONE-ACETAMINOPHEN 5-325 MG PO TABS: 1.0000 | ORAL_TABLET | ORAL | 0 refills | Status: DC | PRN

## 2016-06-24 MED ORDER — DEXAMETHASONE SODIUM PHOSPHATE 10 MG/ML IJ SOLN
INTRAMUSCULAR | Status: DC | PRN
  Administered 2016-06-24: 5 mg via INTRAVENOUS

## 2016-06-24 MED ORDER — IPRATROPIUM-ALBUTEROL 0.5-2.5 (3) MG/3ML IN SOLN
RESPIRATORY_TRACT | Status: AC
  Filled 2016-06-24: qty 3

## 2016-06-24 MED ORDER — PHENYLEPHRINE HCL 10 MG/ML IJ SOLN
INTRAMUSCULAR | Status: DC | PRN
  Administered 2016-06-24: 100 ug via INTRAVENOUS

## 2016-06-24 MED ORDER — PROPOFOL 10 MG/ML IV BOLUS
INTRAVENOUS | Status: DC | PRN
  Administered 2016-06-24: 150 mg via INTRAVENOUS

## 2016-06-24 MED ORDER — SUCCINYLCHOLINE CHLORIDE 20 MG/ML IJ SOLN
INTRAMUSCULAR | Status: DC | PRN
  Administered 2016-06-24: 80 mg via INTRAVENOUS

## 2016-06-24 MED ORDER — BUPIVACAINE HCL (PF) 0.5 % IJ SOLN
INTRAMUSCULAR | Status: AC
  Filled 2016-06-24: qty 30

## 2016-06-24 MED ORDER — HYDROMORPHONE HCL 1 MG/ML IJ SOLN
INTRAMUSCULAR | Status: DC | PRN
  Administered 2016-06-24: 0.5 mg via INTRAVENOUS

## 2016-06-24 MED ORDER — LIDOCAINE HCL (CARDIAC) 20 MG/ML IV SOLN
INTRAVENOUS | Status: DC | PRN
  Administered 2016-06-24: 100 mg via INTRAVENOUS

## 2016-06-24 MED ORDER — ONDANSETRON HCL 4 MG/2ML IJ SOLN
INTRAMUSCULAR | Status: DC | PRN
  Administered 2016-06-24: 4 mg via INTRAVENOUS

## 2016-06-24 SURGICAL SUPPLY — 41 items
ADHESIVE MASTISOL STRL (MISCELLANEOUS) ×2 IMPLANT
APPLIER CLIP ROT 10 11.4 M/L (STAPLE) ×2
BAG COUNTER SPONGE EZ (MISCELLANEOUS) ×2 IMPLANT
BLADE SURG SZ11 CARB STEEL (BLADE) ×2 IMPLANT
CANISTER SUCT 1200ML W/VALVE (MISCELLANEOUS) ×2 IMPLANT
CATH CHOLANG 76X19 KUMAR (CATHETERS) ×2 IMPLANT
CHLORAPREP W/TINT 26ML (MISCELLANEOUS) ×2 IMPLANT
CLIP APPLIE ROT 10 11.4 M/L (STAPLE) ×1 IMPLANT
DRAPE SHEET LG 3/4 BI-LAMINATE (DRAPES) ×2 IMPLANT
DRESSING TELFA 4X3 1S ST N-ADH (GAUZE/BANDAGES/DRESSINGS) ×2 IMPLANT
DRSG TEGADERM 2-3/8X2-3/4 SM (GAUZE/BANDAGES/DRESSINGS) ×8 IMPLANT
DRSG TEGADERM 4X4.75 (GAUZE/BANDAGES/DRESSINGS) ×2 IMPLANT
ELECT REM PT RETURN 9FT ADLT (ELECTROSURGICAL) ×2
ELECTRODE REM PT RTRN 9FT ADLT (ELECTROSURGICAL) ×1 IMPLANT
GLOVE BIO SURGEON STRL SZ7.5 (GLOVE) ×8 IMPLANT
GLOVE INDICATOR 8.0 STRL GRN (GLOVE) ×6 IMPLANT
GOWN STRL REUS W/ TWL LRG LVL3 (GOWN DISPOSABLE) ×3 IMPLANT
GOWN STRL REUS W/TWL LRG LVL3 (GOWN DISPOSABLE) ×3
GRASPER SUT TROCAR 14GX15 (MISCELLANEOUS) ×2 IMPLANT
IV NS 1000ML (IV SOLUTION) ×1
IV NS 1000ML BAXH (IV SOLUTION) ×1 IMPLANT
L-HOOK LAP DISP 36CM (ELECTROSURGICAL) ×2
LABEL OR SOLS (LABEL) ×2 IMPLANT
LHOOK LAP DISP 36CM (ELECTROSURGICAL) ×1 IMPLANT
NEEDLE HYPO 25X1 1.5 SAFETY (NEEDLE) ×2 IMPLANT
NEEDLE VERESS 14GA 120MM (NEEDLE) ×2 IMPLANT
NS IRRIG 500ML POUR BTL (IV SOLUTION) ×2 IMPLANT
PACK LAP CHOLECYSTECTOMY (MISCELLANEOUS) ×2 IMPLANT
PENCIL ELECTRO HAND CTR (MISCELLANEOUS) ×2 IMPLANT
POUCH ENDO CATCH 10MM SPEC (MISCELLANEOUS) ×2 IMPLANT
SCISSORS METZENBAUM CVD 33 (INSTRUMENTS) ×2 IMPLANT
SLEEVE ENDOPATH XCEL 5M (ENDOMECHANICALS) ×4 IMPLANT
STRIP CLOSURE SKIN 1/2X4 (GAUZE/BANDAGES/DRESSINGS) ×2 IMPLANT
STRIP CLOSURE SKIN 1/4X4 (GAUZE/BANDAGES/DRESSINGS) ×2 IMPLANT
SUT MNCRL 4-0 (SUTURE) ×1
SUT MNCRL 4-0 27XMFL (SUTURE) ×1
SUT VICRYL 0 AB UR-6 (SUTURE) ×2 IMPLANT
SUTURE MNCRL 4-0 27XMF (SUTURE) ×1 IMPLANT
TROCAR XCEL 12X100 BLDLESS (ENDOMECHANICALS) ×2 IMPLANT
TROCAR XCEL NON-BLD 5MMX100MML (ENDOMECHANICALS) ×2 IMPLANT
TUBING INSUFFLATOR HI FLOW (MISCELLANEOUS) ×2 IMPLANT

## 2016-06-24 NOTE — Interval H&P Note (Signed)
History and Physical Interval Note:  06/24/2016 12:45 PM  Hannah Smith  has presented today for surgery, with the diagnosis of CALCULUS OF GALLBLADDER  The various methods of treatment have been discussed with the patient and family. After consideration of risks, benefits and other options for treatment, the patient has consented to  Procedure(s): LAPAROSCOPIC CHOLECYSTECTOMY (N/A) as a surgical intervention .  The patient's history has been reviewed, patient examined, no change in status, stable for surgery.  I have reviewed the patient's chart and labs.  Questions were answered to the patient's satisfaction.     Ricarda Frameharles Laelia Angelo

## 2016-06-24 NOTE — Brief Op Note (Signed)
06/24/2016  2:32 PM   PATIENT:  Ignacia FellingJennifer A Dorame  55 y.o. female  PRE-OPERATIVE DIAGNOSIS:  CALCULUS OF GALLBLADDER  POST-OPERATIVE DIAGNOSIS:  CALCULUS OF GALLBLADDER  PROCEDURE:  Procedure(s): LAPAROSCOPIC CHOLECYSTECTOMY (N/A)  SURGEON:  Surgeon(s) and Role:    * Ricarda Frameharles Kian Gamarra, MD - Primary  PHYSICIAN ASSISTANT:   ASSISTANTS: PA Student   ANESTHESIA:   general  EBL:  Total I/O In: 1000 [I.V.:1000] Out: -   BLOOD ADMINISTERED:none  DRAINS: none   LOCAL MEDICATIONS USED:  MARCAINE   , XYLOCAINE  and Amount: 27 ml  SPECIMEN:  Source of Specimen:  gallbladder  DISPOSITION OF SPECIMEN:  PATHOLOGY  COUNTS:  YES  TOURNIQUET:  * No tourniquets in log *  DICTATION: .Dragon Dictation  PLAN OF CARE: Discharge to home after PACU  PATIENT DISPOSITION:  PACU - hemodynamically stable.   Delay start of Pharmacological VTE agent (>24hrs) due to surgical blood loss or risk of bleeding: not applicable

## 2016-06-24 NOTE — Anesthesia Procedure Notes (Signed)
Procedure Name: Intubation Date/Time: 06/24/2016 1:31 PM Performed by: Almeta MonasFLETCHER, Briell Paulette Pre-anesthesia Checklist: Patient identified, Patient being monitored, Timeout performed, Emergency Drugs available and Suction available Patient Re-evaluated:Patient Re-evaluated prior to inductionOxygen Delivery Method: Circle system utilized Preoxygenation: Pre-oxygenation with 100% oxygen Intubation Type: IV induction Ventilation: Mask ventilation without difficulty Laryngoscope Size: Mac and 3 Grade View: Grade I Tube type: Oral Tube size: 7.0 mm Number of attempts: 1 Airway Equipment and Method: Stylet and Patient positioned with wedge pillow Placement Confirmation: ETT inserted through vocal cords under direct vision,  positive ETCO2 and breath sounds checked- equal and bilateral Secured at: 21 cm Tube secured with: Tape Dental Injury: Teeth and Oropharynx as per pre-operative assessment and Bloody posterior oropharynx

## 2016-06-24 NOTE — Anesthesia Preprocedure Evaluation (Signed)
Anesthesia Evaluation  Patient identified by MRN, date of birth, ID band Patient awake    Reviewed: Allergy & Precautions, NPO status , Patient's Chart, lab work & pertinent test results  History of Anesthesia Complications (+) AWARENESS UNDER ANESTHESIA  Airway Mallampati: III  TM Distance: >3 FB     Dental  (+) Caps, Chipped   Pulmonary shortness of breath and with exertion, asthma , former smoker,    Pulmonary exam normal        Cardiovascular Normal cardiovascular exam+ dysrhythmias      Neuro/Psych PSYCHIATRIC DISORDERS Anxiety Depression    GI/Hepatic Neg liver ROS, GERD  Medicated,  Endo/Other  negative endocrine ROS  Renal/GU negative Renal ROS     Musculoskeletal  (+) Arthritis , Osteoarthritis,    Abdominal Normal abdominal exam  (+)   Peds negative pediatric ROS (+)  Hematology negative hematology ROS (+)   Anesthesia Other Findings   Reproductive/Obstetrics                             Anesthesia Physical Anesthesia Plan  ASA: II  Anesthesia Plan: General   Post-op Pain Management:    Induction: Intravenous  Airway Management Planned: Oral ETT  Additional Equipment:   Intra-op Plan:   Post-operative Plan: Extubation in OR  Informed Consent: I have reviewed the patients History and Physical, chart, labs and discussed the procedure including the risks, benefits and alternatives for the proposed anesthesia with the patient or authorized representative who has indicated his/her understanding and acceptance.   Dental advisory given  Plan Discussed with: CRNA and Surgeon  Anesthesia Plan Comments:         Anesthesia Quick Evaluation

## 2016-06-24 NOTE — Discharge Instructions (Signed)
Laparoscopic Cholecystectomy, Care After This sheet gives you information about how to care for yourself after your procedure. Your health care provider may also give you more specific instructions. If you have problems or questions, contact your health care provider. What can I expect after the procedure? After the procedure, it is common to have:  Pain at your incision sites. You will be given medicines to control this pain.  Mild nausea or vomiting.  Bloating and possible shoulder pain from the air-like gas that was used during the procedure. Follow these instructions at home: Incision care  Follow instructions from your health care provider about how to take care of your incisions. Make sure you:  Wash your hands with soap and water before you change your bandage (dressing). If soap and water are not available, use hand sanitizer.  Change your dressing as told by your health care provider. Change her dressings after 48 hours. Only replace dressings with a large Band-Aid if there is any evidence of drainage. Continue to change dressings every 24 hours until there is no drainage on her dressing.  Leave stitches (sutures), skin glue, or adhesive strips in place. These skin closures may need to be in place for 2 weeks or longer. If adhesive strip edges start to loosen and curl up, you may trim the loose edges. Do not remove adhesive strips completely unless your health care provider tells you to do that.  Do not take baths, swim, or use a hot tub until your health care provider approves. Ask your health care provider if you can take showers. You may only be allowed to take sponge baths for bathing. Okay to shower in 24 hours. Do not submerge until told otherwise.  Check your incision area every day for signs of infection. Check for:  More redness, swelling, or pain.  More fluid or blood.  Warmth.  Pus or a bad smell. Activity  Do not drive or use heavy machinery while taking  prescription pain medicine.  Do not lift anything that is heavier than 10 lb (4.5 kg) until your health care provider approves.  Do not play contact sports until your health care provider approves.  Do not drive for 24 hours if you were given a medicine to help you relax (sedative).  Rest as needed. Do not return to work or school until your health care provider approves. General instructions  Take over-the-counter and prescription medicines only as told by your health care provider.  To prevent or treat constipation while you are taking prescription pain medicine, your health care provider may recommend that you:  Drink enough fluid to keep your urine clear or pale yellow.  Take over-the-counter or prescription medicines.  Eat foods that are high in fiber, such as fresh fruits and vegetables, whole grains, and beans.  Limit foods that are high in fat and processed sugars, such as fried and sweet foods. Contact a health care provider if:  You develop a rash.  You have more redness, swelling, or pain around your incisions.  You have more fluid or blood coming from your incisions.  Your incisions feel warm to the touch.  You have pus or a bad smell coming from your incisions.  You have a fever.  One or more of your incisions breaks open. Get help right away if:  You have trouble breathing.  You have chest pain.  You have increasing pain in your shoulders.  You faint or feel dizzy when you stand.  You have severe  pain in your abdomen.  You have nausea or vomiting that lasts for more than one day.  You have leg pain. This information is not intended to replace advice given to you by your health care provider. Make sure you discuss any questions you have with your health care provider. Document Released: 07/25/2005 Document Revised: 02/13/2016 Document Reviewed: 01/11/2016 Elsevier Interactive Patient Education  2017 [employer]  AMBULATORY SURGERY  DISCHARGE  INSTRUCTIONS   1) The drugs that you were given will stay in your system until tomorrow so for the next 24 hours you should not:  A) Drive an automobile B) Make any legal decisions C) Drink any alcoholic beverage   2) You may resume regular meals tomorrow.  Today it is better to start with liquids and gradually work up to solid foods.  You may eat anything you prefer, but it is better to start with liquids, then soup and crackers, and gradually work up to solid foods.   3) Please notify your doctor immediately if you have any unusual bleeding, trouble breathing, redness and pain at the surgery site, drainage, fever, or pain not relieved by medication.    4) Additional Instructions:        Please contact your physician with any problems or Same Day Surgery at [phone], Monday through Friday 6 am to 4 pm, or [place] at [facility] Main number at [phone].

## 2016-06-24 NOTE — H&P (View-Only) (Signed)
Patient ID: Hannah Smith, female   DOB: 04/19/1961, 55 y.o.   MRN: [id number]  CC: RUQ PAIN  HPI Hannah Smith is a 55 y.o. female who presents to clinic today for evaluation of right upper quadrant pain. Patient states he was seen in the ER last week and was diagnosed with gallstones. Last week which her first ever attack. She reports since then she has had some persistent discomfort in the right upper quadrant but not as bad as that time. She has had nausea with the attacks but no vomiting. She also states that after tach she's had some loose stools and some belching but denies any fevers, chills, chest pain, shortness of breath, vomiting, constipation. When she has the pain she says it is a sharp pain in her right upper quadrant but does not shoot through her. She also developed headaches with it. She has had a decrease in appetite secondary to the pain because she is afraid to eat thinking that it might cause the pain. She also has a history of depression and is currently on medications for such. She is otherwise in her usual state of health. 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  HPI  Past Medical History:  Diagnosis Date  . Arthritis    bilateral hands  . Asthma   . Depression   . Tachycardia     Past Surgical History:  Procedure Laterality Date  . ABDOMINAL HYSTERECTOMY    . BLADDER SURGERY  2011  . HYSTERECTOMY ABDOMINAL WITH SALPINGECTOMY  2011  . INCONTINENCE SURGERY    . TUBAL LIGATION  1992    Family History  Problem Relation Age of Onset  . Hypertension Mother   . Stroke Mother     Social History Social History  Substance Use Topics  . Smoking status: Former Smoker  . Smokeless tobacco: Never Used     Comment: quit 5 years ago  . Alcohol use No    Allergies  Allergen Reactions  . Latex Shortness Of Breath    Current Outpatient Prescriptions  Medication Sig Dispense Refill  . albuterol (PROVENTIL) (5 MG/ML) 0.5% nebulizer solution Take  2.5 mg by nebulization as needed for wheezing.    . citalopram (CELEXA) 40 MG tablet Take 40 mg by mouth daily.    . cyclobenzaprine (FLEXERIL) 5 MG tablet Take 10 mg by mouth at bedtime.     . DULoxetine (CYMBALTA) 30 MG capsule Take 30 mg by mouth daily.    . esomeprazole (NEXIUM) 40 MG capsule Take 40 mg by mouth daily at 12 noon.    . fluticasone (FLONASE) 50 MCG/ACT nasal spray by Nasal route.    . fluticasone-salmeterol (ADVAIR HFA) 230-21 MCG/ACT inhaler Inhale 2 puffs into the lungs as needed.    . meloxicam (MOBIC) 15 MG tablet Take by mouth.    . metoprolol-hydrochlorothiazide (LOPRESSOR HCT) 100-25 MG tablet Take 1 tablet by mouth daily.    . ondansetron (ZOFRAN ODT) 4 MG disintegrating tablet Take 1 tablet (4 mg total) by mouth every 8 (eight) hours as needed for nausea or vomiting. 20 tablet 0  . oxyCODONE-acetaminophen (ROXICET) 5-325 MG tablet Take 1 tablet by mouth every 6 (six) hours as needed. 12 tablet 0   No current facility-administered medications for this visit.      Review of Systems A Multi-point review of systems was asked and was negative except for the findings documented in the history of present illness  Physical Exam Blood pressure   121/82, pulse 77, temperature 98 F (36.7 C), temperature source Oral, height 5\' 3"  (1.6 m), weight 78 kg (172 lb). CONSTITUTIONAL: No acute distress. EYES: Pupils are equal, round, and reactive to light, Sclera are non-icteric. EARS, NOSE, MOUTH AND THROAT: The oropharynx is clear. The oral mucosa is pink and moist. Hearing is intact to voice. LYMPH NODES:  Lymph nodes in the neck are normal. RESPIRATORY:  Lungs are clear. There is normal respiratory effort, with equal breath sounds bilaterally, and without pathologic use of accessory muscles. CARDIOVASCULAR: Heart is regular without murmurs, gallops, or rubs. GI: The abdomen is soft, nontender, and nondistended. There are no palpable masses. There is no hepatosplenomegaly. There  are normal bowel sounds in all quadrants. GU: Rectal deferred.   MUSCULOSKELETAL: Normal muscle strength and tone. No cyanosis or edema.   SKIN: Turgor is good and there are no pathologic skin lesions or ulcers. NEUROLOGIC: Motor and sensation is grossly normal. Cranial nerves are grossly intact. PSYCH:  Oriented to person, place and time. Affect is normal.  Data Reviewed Images and labs reviewed. Labs performed in the ER her all within normal limits with a white blood cell count 9.3, total bilirubin 0.3, a LT 29, AST 23 the remainder are all normal. Ultrasound the gallbladder does show gallstones, common bile duct with normal 4 mm, no evidence of gall or wall thickening or pericholecystic fluid. I have personally reviewed the patient's imaging, laboratory findings and medical records.    Assessment    Symptomatic cholelithiasis    Plan    55 year old female with symptomatic cholelithiasis. Discussed the diagnosis in detail as well as the treatment being a laparoscopic cholecystectomy. I discussed the procedure in detail.  The patient was given Agricultural engineereducational material.  We discussed the risks and benefits of a laparoscopic cholecystectomy and possible cholangiogram including, but not limited to bleeding, infection, injury to surrounding structures such as the intestine or liver, bile leak, retained gallstones, need to convert to an open procedure, prolonged diarrhea, blood clots such as  DVT, common bile duct injury, anesthesia risks, and possible need for additional procedures.  The likelihood of improvement in symptoms and return to the patient's normal status is good. We discussed the typical post-operative recovery course. Patient voiced understanding and a desire to proceed. We will plan with surgery on Friday, November 17.      Time spent with the patient was 45 minutes, with more than 50% of the time spent in face-to-face education, counseling and care coordination.     Ricarda Frameharles Slayton Lubitz,  MD FACS General Surgeon 06/08/2016, 2:03 PM

## 2016-06-24 NOTE — Transfer of Care (Signed)
Immediate Anesthesia Transfer of Care Note  Patient: Ignacia FellingJennifer A Pelham  Procedure(s) Performed: Procedure(s): LAPAROSCOPIC CHOLECYSTECTOMY (N/A)  Patient Location: PACU  Anesthesia Type:General  Level of Consciousness: sedated  Airway & Oxygen Therapy: Patient Spontanous Breathing and Patient connected to face mask oxygen  Post-op Assessment: Report given to RN and Post -op Vital signs reviewed and stable  Post vital signs: Reviewed and stable  Last Vitals:  Vitals:   06/24/16 1005 06/24/16 1444  BP: 106/73 125/82  Pulse: 80 62  Resp: 16 15  Temp: 36.7 C 36.3 C    Last Pain:  Vitals:   06/24/16 1005  TempSrc: Oral  PainSc: 3          Complications: No apparent anesthesia complications

## 2016-06-24 NOTE — Op Note (Signed)
Laparoscopic Cholecystectomy  Pre-operative Diagnosis: Symptomatic cholelithiasis  Post-operative Diagnosis: Previous cholecystitis  Procedure: Laparoscopic cholecystectomy  Surgeon: Leonette Mostharles T. Tonita CongWoodham, MD FACS  Anesthesia: Gen. with endotracheal tube  Assistant: PA student  Procedure Details  The patient was seen again in the Holding Room. The benefits, complications, treatment options, and expected outcomes were discussed with the patient. The risks of bleeding, infection, recurrence of symptoms, failure to resolve symptoms, bile duct damage, bile duct leak, retained common bile duct stone, bowel injury, any of which could require further surgery and/or ERCP, stent, or papillotomy were reviewed with the patient. The likelihood of improving the patient's symptoms with return to their baseline status is good.  The patient and/or family concurred with the proposed plan, giving informed consent.  The patient was taken to Operating Room, identified as Hannah Smith and the procedure verified as Laparoscopic Cholecystectomy.  A Time Out was held and the above information confirmed.  Prior to the induction of general anesthesia, antibiotic prophylaxis was administered. VTE prophylaxis was in place. General endotracheal anesthesia was then administered and tolerated well. After the induction, the abdomen was prepped with Chloraprep and draped in the sterile fashion. The patient was positioned in the supine position.  Local anesthetic was injected into the skin in the right upper quadrant 2 fingerbreadths below the costal margin in the midclavicular line. The Veress needle was placed. Pneumoperitoneum was then created with CO2 and tolerated well without any adverse changes in the patient's vital signs. A 5mm Optiview port was placed in the in the incision and the abdominal cavity was explored.  Two 5-mm ports were placed, one in the periumbilical position and one in the right upper quadrant and a 12  mm epigastric port was placed all under direct vision. All skin incisions  were infiltrated with a local anesthetic agent before making the incision and placing the trocars.   The patient was positioned  in reverse Trendelenburg, tilted slightly to the patient's left.  The gallbladder was identified, the fundus grasped and retracted cephalad. Adhesions were lysed bluntly. The infundibulum was grasped and retracted laterally, exposing the peritoneum overlying the triangle of Calot. This was then divided and exposed in a blunt fashion. A critical view of the cystic duct and cystic artery was obtained.  The cystic duct was clearly identified and bluntly dissected.   The anatomy was immediately visualized and easily identified. There was a thick and hard rind of inflammatory peritoneum overlying the cystic artery and duct. A posterior and anterior branch were easily visualized due to the lack of any adipose tissue over the gallbladder. These were all circumferentially freed up with a [place] dissector prior to being clipped. The were clipped with endoclips 2 in the proximal stay and one on the distal colon cut in between EndoShears.  The gallbladder was taken from the gallbladder fossa in a retrograde fashion with the electrocautery. The gallbladder was removed and placed in an Endocatch bag. The liver bed was inspected. Hemostasis was already achieved with the electrocautery during the dissection. The gallbladder and Endocatch sac were then removed through the epigastric port site. The gallbladder was able be removed without any dilatation of the trocar site.  Inspection of the right upper quadrant was performed. No bleeding, bile duct injury or leak, or bowel injury was noted. Pneumoperitoneum was released.  The epigastric port site was closed with figure-of-eight 0 Vicryl sutures. 4-0 subcuticular Monocryl was used to close the skin. Steristrips and Mastisol and sterile dressings were  applied.  The patient  was then extubated and brought to the recovery room in stable condition. Sponge, lap, and needle counts were correct at closure and at the conclusion of the case.   Findings: Previous Cholecystitis   Estimated Blood Loss: 10 mL         Drains: None         Specimens: Gallbladder           Complications: none               Kayia Billinger T. Tonita CongWoodham, MD, FACS

## 2016-06-27 ENCOUNTER — Encounter: Payer: Self-pay | Admitting: General Surgery

## 2016-06-27 ENCOUNTER — Telehealth: Payer: Self-pay

## 2016-06-27 NOTE — Anesthesia Postprocedure Evaluation (Signed)
Anesthesia Post Note  Patient: Hannah FellingJennifer A Smith  Procedure(s) Performed: Procedure(s) (LRB): LAPAROSCOPIC CHOLECYSTECTOMY (N/A)  Patient location during evaluation: PACU Anesthesia Type: General Level of consciousness: awake and alert Pain management: pain level controlled Vital Signs Assessment: post-procedure vital signs reviewed and stable Respiratory status: spontaneous breathing, nonlabored ventilation, respiratory function stable and patient connected to nasal cannula oxygen Cardiovascular status: blood pressure returned to baseline and stable Postop Assessment: no signs of nausea or vomiting Anesthetic complications: no    Last Vitals:  Vitals:   06/24/16 1544 06/24/16 1623  BP: (!) 114/50 128/90  Pulse: 80 76  Resp: 16 16  Temp: 36.9 C 36.7 C    Last Pain:  Vitals:   06/27/16 0905  TempSrc:   PainSc: 0-No pain                 Lenard SimmerAndrew Dezzie Badilla

## 2016-06-27 NOTE — Telephone Encounter (Signed)
Patient's disability form was filled out and faxed to [employer]. 

## 2016-06-28 LAB — SURGICAL PATHOLOGY

## 2016-07-07 ENCOUNTER — Encounter: Payer: Self-pay | Admitting: Surgery

## 2016-07-07 ENCOUNTER — Ambulatory Visit (INDEPENDENT_AMBULATORY_CARE_PROVIDER_SITE_OTHER): Payer: 59 | Admitting: Surgery

## 2016-07-07 VITALS — BP 128/74 | HR 73 | Temp 98.3°F | Ht 64.0 in | Wt 169.0 lb

## 2016-07-07 DIAGNOSIS — K8010 Calculus of gallbladder with chronic cholecystitis without obstruction: Secondary | ICD-10-CM

## 2016-07-07 NOTE — Patient Instructions (Addendum)
Please have your FMLA paperwork faxed over to our office [phone].  Please call with any questions or concerns.

## 2016-07-07 NOTE — Progress Notes (Signed)
Outpatient Surgical Follow Up  07/07/2016  Hannah Smith is an 55 y.o. female seen for the diagnosis of Calculus of gallbladder with chronic cholecystitis without obstruction [K80.10].  HPI: Patient seen and examined in clinic. She underwent a laparoscopic cholecystectomy on 11/7 with Dr. Tonita CongWoodham. Overall she is doing well with no acute complaints. she denies N/V, D/C, fevers or malaise.  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 Her appetite is back she is having some diarrhea occasionally. Discussed avoiding fatty foods and making sure that she eats regular small meals and doesn't go long periods of time without eating.  Past Medical History:  Diagnosis Date  . Anxiety   . Arthritis    bilateral hands  . Asthma    exercise induced  . Complication of anesthesia    difficulty standing under with previous surgeries  . Depression   . Dyspnea    with exertion  . GERD (gastroesophageal reflux disease)   . Tachycardia     Past Surgical History:  Procedure Laterality Date  . ABDOMINAL HYSTERECTOMY    . BLADDER SURGERY  2011  . CHOLECYSTECTOMY N/A 06/24/2016   Procedure: LAPAROSCOPIC CHOLECYSTECTOMY;  Surgeon: Ricarda Frameharles Woodham, MD;  Location: [facility] ORS;  Service: General;  Laterality: N/A;  . HYSTERECTOMY ABDOMINAL WITH SALPINGECTOMY  2011  . INCONTINENCE SURGERY    . TUBAL LIGATION  1992    Family History  Problem Relation Age of Onset  . Hypertension Mother   . Stroke Mother     Social History:  reports that she quit smoking about 5 years ago. Her smoking use included Cigarettes. She has never used smokeless tobacco. She reports that she does not drink alcohol or use drugs.  Allergies:  Allergies  Allergen Reactions  . Latex Shortness Of Breath  . Lactose Intolerance (Gi)     Bloating, discomfort   . Other     Red meat - pt preference   . Psudatabs [Pseudoephedrine Hcl]     anxiety    Medications  reviewed.  Physical Exam:  BP 128/74   Pulse 73   Temp 98.3 F (36.8 C) (Oral)   Ht 5\' 4"  (1.626 m)   Wt 169 lb (76.7 kg)   BMI 29.01 kg/m   Gen: patient resting comfortably in clinic, no cardiovascular or respiratory distress Abd/GI: Soft nontender incisions clean dry and intact no erythema or drainage  No results found for this or any previous visit (from the past 48 hour(s)). No results found.  Assessment/Plan: Hannah Smith is an 55 y.o. female seen for the diagnosis of Calculus of gallbladder with chronic cholecystitis without obstruction [K80.10]. Progressing as expected. I discussed her pathology of chronic cholecystitis with cholelithiasis and no evidence of malignancy.   Patient can call us with any questions or concerns.  Keyerra Lamere L. Madalena Kesecker MD General Surgeon  07/07/2016,2:44 PM

## 2016-07-11 ENCOUNTER — Telehealth: Payer: Self-pay | Admitting: Surgery

## 2016-07-11 NOTE — Telephone Encounter (Signed)
Called patient back and left her a detailed message in reference to her questions. I told her that she is able to bathe if her incision sites were completely healed. In reference to exercising, I stated that she was to wait at least until 07/22/2016.

## 2016-07-11 NOTE — Telephone Encounter (Signed)
Patient would like to know if she has any exercise restrictions? Can she bath? Can she swim? Can she do cardio?

## 2016-07-13 ENCOUNTER — Telehealth: Payer: Self-pay

## 2016-07-13 NOTE — Telephone Encounter (Signed)
Patient's Disability Forms from [employer] and [employer] were filled out and faxed. 

## 2016-10-14 DIAGNOSIS — F4312 Post-traumatic stress disorder, chronic: Secondary | ICD-10-CM | POA: Diagnosis not present

## 2016-10-14 DIAGNOSIS — F411 Generalized anxiety disorder: Secondary | ICD-10-CM | POA: Diagnosis not present

## 2016-10-14 DIAGNOSIS — R002 Palpitations: Secondary | ICD-10-CM | POA: Diagnosis not present

## 2016-10-14 DIAGNOSIS — F5105 Insomnia due to other mental disorder: Secondary | ICD-10-CM | POA: Diagnosis not present

## 2016-10-14 DIAGNOSIS — F331 Major depressive disorder, recurrent, moderate: Secondary | ICD-10-CM | POA: Diagnosis not present

## 2016-10-14 DIAGNOSIS — F329 Major depressive disorder, single episode, unspecified: Secondary | ICD-10-CM | POA: Diagnosis not present

## 2016-10-14 DIAGNOSIS — R5383 Other fatigue: Secondary | ICD-10-CM | POA: Diagnosis not present

## 2016-10-14 DIAGNOSIS — E782 Mixed hyperlipidemia: Secondary | ICD-10-CM | POA: Diagnosis not present

## 2016-10-24 DIAGNOSIS — H524 Presbyopia: Secondary | ICD-10-CM | POA: Diagnosis not present

## 2016-10-25 DIAGNOSIS — F4312 Post-traumatic stress disorder, chronic: Secondary | ICD-10-CM | POA: Diagnosis not present

## 2016-10-25 DIAGNOSIS — F411 Generalized anxiety disorder: Secondary | ICD-10-CM | POA: Diagnosis not present

## 2016-10-25 DIAGNOSIS — F331 Major depressive disorder, recurrent, moderate: Secondary | ICD-10-CM | POA: Diagnosis not present

## 2016-10-25 DIAGNOSIS — F5105 Insomnia due to other mental disorder: Secondary | ICD-10-CM | POA: Diagnosis not present

## 2017-02-13 ENCOUNTER — Encounter: Payer: Self-pay | Admitting: Psychology

## 2017-02-14 ENCOUNTER — Other Ambulatory Visit: Payer: Self-pay | Admitting: Internal Medicine

## 2017-02-14 DIAGNOSIS — E039 Hypothyroidism, unspecified: Secondary | ICD-10-CM

## 2017-02-20 ENCOUNTER — Ambulatory Visit
Admission: RE | Admit: 2017-02-20 | Discharge: 2017-02-20 | Disposition: A | Discharge status: Home / Self Care | Payer: BLUE CROSS/BLUE SHIELD | Source: Ambulatory Visit | Attending: Internal Medicine | Admitting: Internal Medicine

## 2017-02-20 DIAGNOSIS — E039 Hypothyroidism, unspecified: Secondary | ICD-10-CM

## 2017-03-31 ENCOUNTER — Other Ambulatory Visit (HOSPITAL_COMMUNITY): Payer: Self-pay | Admitting: Neurology

## 2017-03-31 DIAGNOSIS — R413 Other amnesia: Secondary | ICD-10-CM

## 2017-04-06 ENCOUNTER — Ambulatory Visit (HOSPITAL_COMMUNITY)
Admission: RE | Admit: 2017-04-06 | Discharge: 2017-04-06 | Disposition: A | Discharge status: Home / Self Care | Payer: BLUE CROSS/BLUE SHIELD | Source: Ambulatory Visit | Attending: Neurology | Admitting: Neurology

## 2017-04-06 ENCOUNTER — Encounter (HOSPITAL_COMMUNITY): Payer: Self-pay | Admitting: Radiology

## 2017-04-06 DIAGNOSIS — R413 Other amnesia: Secondary | ICD-10-CM | POA: Diagnosis present

## 2017-05-19 ENCOUNTER — Emergency Department
Admission: EM | Admit: 2017-05-19 | Discharge: 2017-05-19 | Discharge status: Absent without leave | Payer: BLUE CROSS/BLUE SHIELD

## 2017-05-30 ENCOUNTER — Ambulatory Visit (INDEPENDENT_AMBULATORY_CARE_PROVIDER_SITE_OTHER): Payer: BLUE CROSS/BLUE SHIELD | Admitting: Psychology

## 2017-05-30 ENCOUNTER — Encounter: Payer: Self-pay | Admitting: Psychology

## 2017-05-30 DIAGNOSIS — R413 Other amnesia: Secondary | ICD-10-CM

## 2017-05-30 DIAGNOSIS — F329 Major depressive disorder, single episode, unspecified: Secondary | ICD-10-CM

## 2017-05-30 DIAGNOSIS — R41840 Attention and concentration deficit: Secondary | ICD-10-CM

## 2017-05-30 DIAGNOSIS — F32A Depression, unspecified: Secondary | ICD-10-CM

## 2017-05-30 DIAGNOSIS — F419 Anxiety disorder, unspecified: Secondary | ICD-10-CM
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
## 2017-05-31 ENCOUNTER — Ambulatory Visit (INDEPENDENT_AMBULATORY_CARE_PROVIDER_SITE_OTHER): Payer: BLUE CROSS/BLUE SHIELD | Admitting: Psychology

## 2017-05-31 DIAGNOSIS — R413 Other amnesia: Secondary | ICD-10-CM | POA: Diagnosis not present

## 2017-05-31 NOTE — Progress Notes (Signed)
   Neuropsychology Note  Hannah FellingJennifer A Smith returned today for 3 hours of neuropsychological testing with technician, Wallace Kellerana Chamberlain, BS, under the supervision of Dr. Elvis CoilMaryBeth Bailar. The patient did not appear overtly distressed by the testing session, per behavioral observation or via self-report to the technician. Rest breaks were offered. Hannah FellingJennifer A Smith will return within 2 weeks for a feedback session with [name] at which time her test performances, clinical impressions and treatment recommendations will be reviewed in detail. The patient understands she can contact our office should she require our assistance before this time.  Full report to follow.
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
## 2017-06-08 ENCOUNTER — Ambulatory Visit (INDEPENDENT_AMBULATORY_CARE_PROVIDER_SITE_OTHER): Payer: BLUE CROSS/BLUE SHIELD | Admitting: Psychology

## 2017-06-08 ENCOUNTER — Encounter: Payer: Self-pay | Admitting: Psychology

## 2017-06-08 DIAGNOSIS — R413 Other amnesia: Secondary | ICD-10-CM | POA: Diagnosis not present

## 2017-06-08 DIAGNOSIS — R41840 Attention and concentration deficit: Secondary | ICD-10-CM

## 2017-06-12 ENCOUNTER — Other Ambulatory Visit: Payer: Self-pay | Admitting: Internal Medicine

## 2017-06-12 DIAGNOSIS — Z1231 Encounter for screening mammogram for malignant neoplasm of breast: Secondary | ICD-10-CM

## 2017-06-25 ENCOUNTER — Emergency Department
Admission: EM | Admit: 2017-06-25 | Discharge: 2017-06-26 | Disposition: A | Discharge status: Other Acute Inpatient Hospital | Payer: BLUE CROSS/BLUE SHIELD | Attending: Emergency Medicine | Admitting: Emergency Medicine

## 2017-06-25 ENCOUNTER — Other Ambulatory Visit: Payer: Self-pay

## 2017-06-25 ENCOUNTER — Encounter: Payer: Self-pay | Admitting: Emergency Medicine

## 2017-06-25 DIAGNOSIS — F419 Anxiety disorder, unspecified: Secondary | ICD-10-CM | POA: Insufficient documentation

## 2017-06-25 DIAGNOSIS — F331 Major depressive disorder, recurrent, moderate: Secondary | ICD-10-CM

## 2017-06-25 DIAGNOSIS — F329 Major depressive disorder, single episode, unspecified: Secondary | ICD-10-CM | POA: Diagnosis present

## 2017-06-25 DIAGNOSIS — Z87891 Personal history of nicotine dependence: Secondary | ICD-10-CM | POA: Insufficient documentation

## 2017-06-25 DIAGNOSIS — Z79899 Other long term (current) drug therapy: Secondary | ICD-10-CM | POA: Diagnosis not present

## 2017-06-25 DIAGNOSIS — J45909 Unspecified asthma, uncomplicated: Secondary | ICD-10-CM | POA: Diagnosis not present

## 2017-06-25 DIAGNOSIS — Z046 Encounter for general psychiatric examination, requested by authority: Secondary | ICD-10-CM | POA: Insufficient documentation

## 2017-06-25 DIAGNOSIS — R45851 Suicidal ideations: Secondary | ICD-10-CM | POA: Insufficient documentation

## 2017-06-25 DIAGNOSIS — Z91040 Latex allergy status: Secondary | ICD-10-CM | POA: Insufficient documentation

## 2017-06-25 LAB — URINE DRUG SCREEN, QUALITATIVE (ARMC ONLY)
AMPHETAMINES, UR SCREEN: POSITIVE — AB
BARBITURATES, UR SCREEN: NOT DETECTED
BENZODIAZEPINE, UR SCRN: POSITIVE — AB
Cannabinoid 50 Ng, Ur ~~LOC~~: NOT DETECTED
Cocaine Metabolite,Ur ~~LOC~~: NOT DETECTED
MDMA (Ecstasy)Ur Screen: NOT DETECTED
METHADONE SCREEN, URINE: NOT DETECTED
Opiate, Ur Screen: NOT DETECTED
PHENCYCLIDINE (PCP) UR S: NOT DETECTED
Tricyclic, Ur Screen: NOT DETECTED

## 2017-06-25 LAB — COMPREHENSIVE METABOLIC PANEL
ALT: 30 U/L (ref 14–54)
ANION GAP: 9 (ref 5–15)
AST: 34 U/L (ref 15–41)
Albumin: 4 g/dL (ref 3.5–5.0)
Alkaline Phosphatase: 59 U/L (ref 38–126)
BILIRUBIN TOTAL: 0.7 mg/dL (ref 0.3–1.2)
BUN: 10 mg/dL (ref 6–20)
CO2: 27 mmol/L (ref 22–32)
Calcium: 8.9 mg/dL (ref 8.9–10.3)
Chloride: 103 mmol/L (ref 101–111)
Creatinine, Ser: 0.8 mg/dL (ref 0.44–1.00)
Glucose, Bld: 83 mg/dL (ref 65–99)
POTASSIUM: 3.8 mmol/L (ref 3.5–5.1)
Sodium: 139 mmol/L (ref 135–145)
TOTAL PROTEIN: 6.9 g/dL (ref 6.5–8.1)

## 2017-06-25 LAB — CBC
HCT: 39 % (ref 35.0–47.0)
Hemoglobin: 13 g/dL (ref 12.0–16.0)
MCH: 28.1 pg (ref 26.0–34.0)
MCHC: 33.3 g/dL (ref 32.0–36.0)
MCV: 84.4 fL (ref 80.0–100.0)
PLATELETS: 350 10*3/uL (ref 150–440)
RBC: 4.62 MIL/uL (ref 3.80–5.20)
RDW: 14 % (ref 11.5–14.5)
WBC: 12.1 10*3/uL — AB (ref 3.6–11.0)

## 2017-06-25 LAB — ACETAMINOPHEN LEVEL

## 2017-06-25 LAB — SALICYLATE LEVEL: Salicylate Lvl: <7 mg/dL (ref 2.8–30.0)

## 2017-06-25 LAB — ETHANOL: ALCOHOL ETHYL (B): 78 mg/dL — AB (ref <10)

## 2017-06-25 MED ORDER — AMPHETAMINE-DEXTROAMPHETAMINE 5 MG PO TABS
10.0000 mg | ORAL_TABLET | Freq: Every day | ORAL | Status: DC
  Administered 2017-06-25 – 2017-06-26 (×2): 10 mg via ORAL
  Filled 2017-06-25 (×2): qty 2

## 2017-06-25 MED ORDER — FLUOXETINE HCL 20 MG PO CAPS
20.0000 mg | ORAL_CAPSULE | Freq: Every day | ORAL | Status: DC
  Administered 2017-06-25: 20 mg via ORAL
  Filled 2017-06-25: qty 1

## 2017-06-25 MED ORDER — DULOXETINE HCL 30 MG PO CPEP
30.0000 mg | ORAL_CAPSULE | Freq: Every day | ORAL | Status: DC
  Administered 2017-06-25: 30 mg via ORAL
  Filled 2017-06-25: qty 1

## 2017-06-25 MED ORDER — AMPHETAMINE-DEXTROAMPHETAMINE 5 MG PO TABS: 10.0000 mg | ORAL_TABLET | Freq: Every day | ORAL | Status: DC

## 2017-06-25 MED ORDER — OLANZAPINE 5 MG PO TBDP
2.5000 mg | ORAL_TABLET | Freq: Every day | ORAL | Status: DC
  Administered 2017-06-25: 2.5 mg via ORAL
  Filled 2017-06-25: qty 1

## 2017-06-25 MED ORDER — FLUTICASONE PROPIONATE 50 MCG/ACT NA SUSP
2.0000 | Freq: Every evening | NASAL | Status: DC
  Administered 2017-06-25: 2 via NASAL
  Filled 2017-06-25: qty 16

## 2017-06-25 MED ORDER — PANTOPRAZOLE SODIUM 40 MG PO TBEC
40.0000 mg | DELAYED_RELEASE_TABLET | Freq: Every day | ORAL | Status: DC
  Administered 2017-06-25 – 2017-06-26 (×2): 40 mg via ORAL
  Filled 2017-06-25 (×2): qty 1

## 2017-06-25 MED ORDER — ALBUTEROL SULFATE HFA 108 (90 BASE) MCG/ACT IN AERS
2.0000 | INHALATION_SPRAY | Freq: Four times a day (QID) | RESPIRATORY_TRACT | Status: DC | PRN
  Filled 2017-06-25: qty 6.7

## 2017-06-25 MED ORDER — CLONAZEPAM 0.5 MG PO TABS
0.5000 mg | ORAL_TABLET | Freq: Once | ORAL | Status: AC
  Administered 2017-06-25: 0.5 mg via ORAL
  Filled 2017-06-25: qty 1

## 2017-06-25 MED ORDER — LORAZEPAM 1 MG PO TABS
1.0000 mg | ORAL_TABLET | Freq: Four times a day (QID) | ORAL | Status: DC | PRN
  Administered 2017-06-25: 1 mg via ORAL
  Filled 2017-06-25 (×2): qty 1

## 2017-06-25 MED ORDER — METOPROLOL SUCCINATE ER 50 MG PO TB24
50.0000 mg | ORAL_TABLET | Freq: Every day | ORAL | Status: DC
  Administered 2017-06-25 – 2017-06-26 (×2): 50 mg via ORAL
  Filled 2017-06-25 (×2): qty 1

## 2017-06-25 NOTE — ED Notes (Signed)
ENVIRONMENTAL ASSESSMENT  Potentially harmful objects out of patient reach: Yes.  Personal belongings secured: Yes.  Patient dressed in hospital provided attire only: Yes.  Plastic bags out of patient reach: Yes.  Patient care equipment (cords, cables, call bells, lines, and drains) shortened, removed, or accounted for: Yes.  Equipment and supplies removed from bottom of stretcher: Yes.  Potentially toxic materials out of patient reach: Yes.  Sharps container removed or out of patient reach: Yes.   BEHAVIORAL HEALTH ROUNDING  Patient sleeping: No.  Patient alert and oriented: yes  Behavior appropriate: Yes. ; If no, describe:  Nutrition and fluids offered: Yes  Toileting and hygiene offered: Yes  Sitter present: not applicable, Q 15 min safety rounds and observation.  Law enforcement present: Yes ODS  ED BHU PLACEMENT JUSTIFICATION  Is the patient under IVC or is there intent for IVC: Yes.  Is the patient medically cleared: Yes.  Is there vacancy in the ED BHU: Yes.  Is the population mix appropriate for patient: No Is the patient awaiting placement in inpatient or outpatient setting: Yes.  Has the patient had a psychiatric consult: Yes.  Survey of unit performed for contraband, proper placement and condition of furniture, tampering with fixtures in bathroom, shower, and each patient room: Yes. ; Findings: All clear  APPEARANCE/BEHAVIOR  calm, cooperative and adequate rapport can be established  NEURO ASSESSMENT  Orientation: time, place and person  Hallucinations: No.None noted (Hallucinations)  Speech: Normal  Gait: normal  RESPIRATORY ASSESSMENT  WNL  CARDIOVASCULAR ASSESSMENT  WNL  GASTROINTESTINAL ASSESSMENT  WNL  EXTREMITIES  WNL  PLAN OF CARE  Provide calm/safe environment. Vital signs assessed TID. ED BHU Assessment once each 12-hour shift. Collaborate with TTS daily or as condition indicates. Assure the ED provider has rounded once each shift. Provide and encourage  hygiene. Provide redirection as needed. Assess for escalating behavior; address immediately and inform ED provider.  Assess family dynamic and appropriateness for visitation as needed: Yes. ; If necessary, describe findings:  Educate the patient/family about BHU procedures/visitation: Yes. ; If necessary, describe findings: Pt is calm and cooperative at this time. Pt understanding and accepting of unit procedures/rules. Will continue to monitor with Q 15 min safety rounds and observation.     

## 2017-06-25 NOTE — ED Notes (Signed)
IVC by Dr. Fanny BienQuale

## 2017-06-25 NOTE — ED Provider Notes (Signed)
Saw and spoke with the patient, we discussed suicidal ideations and she reports that if she wanted to kill herself she has a clear plan on how she would do it and she has had patients who have been successful in suicide so she knows what to do.  She would not, however, tell me whether or not she actively felt like she was at risk for suicide.  That being said she could not tell me that she would not harm herself if given the opportunity.  In fact, that if she had the opportunity she probably would at least strongly consider harming herself.  Based upon some significant ambivalence regarding whether or not she is suicidal and reports that if she was she had a clear and effective plan to kill herself I have placed her under involuntary commitment.  Psychiatry recommends inpatient, and at this point will trial new medications and I have placed a consult for psychiatry to see her again tomorrow in the emergency room for further disposition and evaluation.  Patient very pleasant, agreeable and understanding of our plan   Sharyn CreamerQuale, Griffey Nicasio, MD 06/25/17 1340

## 2017-06-25 NOTE — ED Triage Notes (Signed)
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 Maryruth BunKapur with ADD. 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 Pt states that "my stress level was so high because I should have been a substance abuser". Pt is agitated but pleasant in triage. Pt denies suicidal attempt but is reports depression and that she just wants to die. Pt is in NAD at this time.

## 2017-06-25 NOTE — ED Notes (Signed)
Patient has been accepted to [facility].  Patient [facility] to [facility] Accepting physician is Dr. Elby Showersaj.  Call report to [phone].  Representative was McKessonJonathan.  ER Staff is aware of it Marchelle Folks([facility].; Dr. Zenda AlpersWebster, ER MD & Dewayne HatchAnn  Patient's Nurse)

## 2017-06-25 NOTE — ED Notes (Signed)

## 2017-06-25 NOTE — ED Notes (Signed)
BEHAVIORAL HEALTH ROUNDING  Patient sleeping: No.  Patient alert and oriented: yes  Behavior appropriate: Yes. ; If no, describe:  Nutrition and fluids offered: Yes  Toileting and hygiene offered: Yes  Sitter present: not applicable, Q 15 min safety rounds and observation.  Law enforcement present: Yes ODS  

## 2017-06-25 NOTE — ED Notes (Signed)
Phone given to patient to call daughter.
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
## 2017-06-25 NOTE — ED Notes (Signed)
Referral information for Psychiatric Hospitalization faxed to;      [facility] ([phone] or [phone]   [facility] ([phone]),    [facility] ([phone]),    [facility] ([phone]),    [facility] ([phone]),     

## 2017-06-25 NOTE — ED Notes (Signed)
Pt reports drinking x 1 glass of wine and x 3 lemon shots this evening in an attempt to relax.

## 2017-06-25 NOTE — ED Provider Notes (Signed)
[facility] Emergency Department Provider Note   ____________________________________________   First MD Initiated Contact with Patient 06/25/17 [phone]     (approximate)  I have reviewed the triage vital signs and the nursing notes.   HISTORY  Chief Complaint Suicidal    HPI Hannah Smith is a 56 y.o. female who presents to the ED from home with a chief complaint of depression with vague suicidal ideation.  Patient has a history of anxiety, recently ADD.  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  Feels anxious and depressed, hopeless.  Denies active SI/HI/AH/VH.  States she has had fleeting suicidal thoughts.  Voices no medical complaints.   Past Medical History:  Diagnosis Date  . Anxiety   . Arthritis    bilateral hands  . Asthma    exercise induced  . Complication of anesthesia    difficulty standing under with previous surgeries  . Depression   . Dyspnea    with exertion  . GERD (gastroesophageal reflux disease)   . Tachycardia     Patient Active Problem List   Diagnosis Date Noted  . Allergic state 06/02/2016  . Depression (emotion) 06/02/2016  . Osteoarthritis 06/02/2016  . Persistent asthma 06/02/2016    Past Surgical History:  Procedure Laterality Date  . ABDOMINAL HYSTERECTOMY    . BLADDER SURGERY  2011  . HYSTERECTOMY ABDOMINAL WITH SALPINGECTOMY  2011  . INCONTINENCE SURGERY    . LAPAROSCOPIC CHOLECYSTECTOMY N/A 06/24/2016   Performed by Ricarda FrameWoodham, Charles, MD [facility] ORS  . TUBAL LIGATION  1992    Prior to Admission medications   Medication Sig Start Date End Date Taking? Authorizing Provider  albuterol (PROVENTIL HFA;VENTOLIN HFA) 108 (90 Base) MCG/ACT inhaler Inhale 2 puffs into the lungs every 6 (six) hours as needed for wheezing or shortness of breath.    [provider]  Calcium Carbonate (CALTRATE 600 PO) Take 2 tablets by mouth every morning.     [provider]  cyclobenzaprine (FLEXERIL) 5 MG tablet Take 5 mg by mouth at bedtime.     [provider]  DULoxetine (CYMBALTA) 30 MG capsule Take 30 mg by mouth daily.    [provider]  esomeprazole (NEXIUM) 40 MG capsule Take 40 mg by mouth daily at 12 noon.    [provider]  fluticasone (FLONASE) 50 MCG/ACT nasal spray Place 2 sprays into both nostrils every evening.     [provider]  loratadine (CLARITIN) 10 MG tablet Take 10 mg by mouth every evening.     [provider]  meloxicam (MOBIC) 7.5 MG tablet Take 7.5 mg by mouth every evening.    [provider]  metoprolol succinate (TOPROL-XL) 50 MG 24 hr tablet Take 50 mg by mouth daily. Take with or immediately following a meal.    [provider]  Nutritional Supplements (ESTROVEN PO) Take 1 tablet by mouth daily.     [provider]    Allergies Latex; Lactose intolerance (gi); Other; and Psudatabs [pseudoephedrine hcl]  Family History  Problem Relation Age of Onset  . Hypertension Mother   . Stroke Mother     Social History Social History   Tobacco Use  . Smoking status: Former Smoker    Types: Cigarettes    Last attempt to quit: 03/09/2011    Years since quitting: 6.3  . Smokeless tobacco: Never Used  . Tobacco comment: quit 5 years ago  Substance  Use Topics  . Alcohol use: Yes    Alcohol/week: 0.0 oz  . Drug use: No    Review of Systems  Constitutional: No fever/chills. Eyes: No visual changes. ENT: No sore throat. Cardiovascular: Denies chest pain. Respiratory: Denies shortness of breath. Gastrointestinal: No abdominal pain.  No nausea, no vomiting.  No diarrhea.  No constipation. Genitourinary: Negative for dysuria. Musculoskeletal: Negative for back pain. Skin: Negative for rash. Neurological: Negative for headaches, focal weakness or numbness. Psychiatric:Positive for  depression.  ____________________________________________   PHYSICAL EXAM:  VITAL SIGNS: ED Triage Vitals  Enc Vitals Group     BP 06/25/17 0539 115/63     Pulse Rate 06/25/17 0539 (!) 104     Resp 06/25/17 0539 18     Temp 06/25/17 0539 98.3 F (36.8 C)     Temp Source 06/25/17 0539 Oral     SpO2 06/25/17 0539 97 %     Weight 06/25/17 0541 165 lb (74.8 kg)     Height 06/25/17 0541 5\' 3"  (1.6 m)     Head Circumference --      Peak Flow --      Pain Score 06/25/17 0539 0     Pain Loc --      Pain Edu? --      Excl. in GC? --     Constitutional: Alert and oriented. Well appearing and in no acute distress. Eyes: Conjunctivae are normal. PERRL. EOMI. Head: Atraumatic. Nose: No congestion/rhinnorhea. Mouth/Throat: Mucous membranes are moist.  Oropharynx non-erythematous. Neck: No stridor.   Cardiovascular: Normal rate, regular rhythm. Grossly normal heart sounds.  Good peripheral circulation. Respiratory: Normal respiratory effort.  No retractions. Lungs CTAB. Gastrointestinal: Soft and nontender. No distention. No abdominal bruits. No CVA tenderness. Musculoskeletal: No lower extremity tenderness nor edema.  No joint effusions. Neurologic:  Normal speech and language. No gross focal neurologic deficits are appreciated. No gait instability. Skin:  Skin is warm, dry and intact. No rash noted. Psychiatric: Mood and affect are flat. Speech and behavior are normal.  ____________________________________________   LABS (all labs ordered are listed, but only abnormal results are displayed)  Labs Reviewed  ETHANOL - Abnormal; Notable for the following components:      Result Value   Alcohol, Ethyl (B) 78 (*)    All other components within normal limits  ACETAMINOPHEN LEVEL - Abnormal; Notable for the following components:   Acetaminophen (Tylenol), Serum <10 (*)    All other components within normal limits  CBC - Abnormal; Notable for the following components:   WBC 12.1 (*)     All other components within normal limits  URINE DRUG SCREEN, QUALITATIVE ([facility] ONLY) - Abnormal; Notable for the following components:   Amphetamines, Ur Screen POSITIVE (*)    Benzodiazepine, Ur Scrn POSITIVE (*)    All other components within normal limits  COMPREHENSIVE METABOLIC PANEL  SALICYLATE LEVEL   ____________________________________________  EKG  None ____________________________________________  RADIOLOGY  No results found.  ____________________________________________   PROCEDURES  Procedure(s) performed: None  Procedures  Critical Care performed: No  ____________________________________________   INITIAL IMPRESSION / ASSESSMENT AND PLAN / ED COURSE  As part of my medical decision making, I reviewed the following data within the electronic MEDICAL RECORD NUMBER Nursing notes reviewed and incorporated, Labs reviewed, Old chart reviewed, A consult was requested and obtained from this/these consultant [facility] psychiatry and Notes from prior ED visits.   56 year old female who presents voluntarily for depression with vague SI.  Contracts for safety  in the emergency department.  Laboratory and urinalysis results remarkable for mildly elevated EtOH, positive amphetamines and benzodiazepines (patient takes Adderall and Klonopin).  She [facility].      ____________________________________________   FINAL CLINICAL IMPRESSION(S) / ED DIAGNOSES  Final diagnoses:  Moderate episode of recurrent major depressive disorder [facility](HCC)     ED Discharge Orders    None       Note:  This document was prepared using Dragon voice recognition software and may include unintentional dictation errors.    Irean HongSung, Tilley Faeth J, MD 06/25/17 [phone]

## 2017-06-25 NOTE — ED Notes (Signed)
Called [facility] for [facility]  [phone]

## 2017-06-25 NOTE — ED Notes (Signed)
Report given to Dr. Maricela BoSprague

## 2017-06-25 NOTE — ED Notes (Signed)
Informed Dr. Fanny BienQuale, pt meds have been entered in the computer and to look at home meds to order for hospital use.

## 2017-06-26 NOTE — ED Notes (Signed)
Patient observed lying in bed with eyes closed  Even, unlabored respirations observed   NAD pt appears to be sleeping  I will continue to monitor along with every 15 minute visual observations and ongoing security monitoring    

## 2017-06-26 NOTE — ED Notes (Signed)
Am meds administered as ordered - assessment completed  She is to transfer to [facility] at any time this am  - pt verbalizes agreement with this plan  Continue to monitor

## 2017-06-26 NOTE — ED Provider Notes (Signed)
-----------------------------------------   7:17 AM on 06/26/2017 -----------------------------------------   Blood pressure 107/61, pulse 82, temperature 98.2 F (36.8 C), temperature source Oral, resp. rate 18, height 5\' 3"  (1.6 m), weight 74.8 kg (165 lb), SpO2 99 %.  The patient had no acute events since last update.  Calm and cooperative at this time.  Disposition is pending Psychiatry/Behavioral Medicine team recommendations.  [facility] by Dr. Sabino Snipesaj   Icess Bertoni, Luisa HartPatrick, MD 06/26/17 [phone]

## 2017-06-26 NOTE — ED Notes (Signed)
BEHAVIORAL HEALTH ROUNDING  Patient sleeping: No.  Patient alert and oriented: yes  Behavior appropriate: Yes. ; If no, describe:  Nutrition and fluids offered: Yes  Toileting and hygiene offered: Yes  Sitter present: not applicable, Q 15 min safety rounds and observation.  Law enforcement present: Yes ODS  

## 2017-06-26 NOTE — ED Notes (Signed)
BEHAVIORAL HEALTH ROUNDING Patient sleeping: Yes.   Patient alert and oriented: not applicable SLEEPING Behavior appropriate: Yes.  ; If no, describe: SLEEPING Nutrition and fluids offered: No SLEEPING Toileting and hygiene offered: NoSLEEPING Sitter present: not applicable, Q 15 min safety rounds and observation. Law enforcement present: Yes ODS 

## 2017-06-26 NOTE — ED Notes (Signed)
ED Is the patient under IVC or is there intent for IVC: Yes.   Is the patient medically cleared: Yes.   Is there vacancy in the ED BHU: Yes.   Is the population mix appropriate for patient: Yes.   Is the patient awaiting placement in inpatient or outpatient setting: Yes.   Has the patient had a psychiatric consult: Yes.   SOC referral for inpt hospitalization   Survey of unit performed for contraband, proper placement and condition of furniture, tampering with fixtures in bathroom, shower, and each patient room: Yes.  ; Findings:  APPEARANCE/BEHAVIOR Calm and cooperative NEURO ASSESSMENT Orientation: oriented x4  Denies pain Hallucinations: No.None noted (Hallucinations) denies  Speech: Normal Gait: normal RESPIRATORY ASSESSMENT Even  Unlabored respirations  CARDIOVASCULAR ASSESSMENT Pulses equal   regular rate  Skin warm and dry   GASTROINTESTINAL ASSESSMENT no GI complaint EXTREMITIES Full ROM  PLAN OF CARE Provide calm/safe environment. Vital signs assessed twice daily. ED BHU Assessment once each 12-hour shift. Collaborate with TTS when they are available or as condition indicates. Assure the ED provider has rounded once each shift. Provide and encourage hygiene. Provide redirection as needed. Assess for escalating behavior; address immediately and inform ED provider.  Assess family dynamic and appropriateness for visitation as needed: Yes.  ; If necessary, describe findings:  Educate the patient/family about BHU procedures/visitation: Yes.  ; If necessary, describe findings:

## 2017-06-26 NOTE — ED Notes (Signed)

## 2017-07-04 ENCOUNTER — Ambulatory Visit
Admission: RE | Admit: 2017-07-04 | Discharge: 2017-07-04 | Disposition: A | Discharge status: Home / Self Care | Payer: BLUE CROSS/BLUE SHIELD | Source: Ambulatory Visit | Attending: Internal Medicine | Admitting: Internal Medicine

## 2017-07-04 DIAGNOSIS — Z1231 Encounter for screening mammogram for malignant neoplasm of breast: Secondary | ICD-10-CM | POA: Insufficient documentation

## 2017-07-17 ENCOUNTER — Ambulatory Visit: Payer: BLUE CROSS/BLUE SHIELD | Admitting: Psychology

## 2017-09-13 ENCOUNTER — Other Ambulatory Visit
Admission: AD | Admit: 2017-09-13 | Discharge: 2017-09-13 | Disposition: A | Discharge status: Home / Self Care | Payer: BLUE CROSS/BLUE SHIELD | Attending: Family Medicine | Admitting: Family Medicine

## 2017-09-13 NOTE — ED Notes (Signed)
[facility]. Blood drawn per hospital policy and turned over to officer Pride. Pt released to police custody. Pt tolerated well without any complications.

## 2017-10-10 ENCOUNTER — Ambulatory Visit (INDEPENDENT_AMBULATORY_CARE_PROVIDER_SITE_OTHER): Payer: BLUE CROSS/BLUE SHIELD | Admitting: Psychology

## 2017-10-10 DIAGNOSIS — F331 Major depressive disorder, recurrent, moderate: Secondary | ICD-10-CM | POA: Diagnosis not present

## 2017-10-17 ENCOUNTER — Ambulatory Visit: Payer: Self-pay | Admitting: Psychology

## 2017-10-31 ENCOUNTER — Ambulatory Visit: Payer: BLUE CROSS/BLUE SHIELD | Admitting: Psychology

## 2019-01-05 IMAGING — MR MR HEAD W/O CM
3 series · 48 of 48 positions shown · non-contrast
Comparison: None.

CLINICAL DATA: 56-year-old female. Insidious onset increased
confusion, cognitive difficulty, decreased focus. Memory loss.
Numbness and weakness in both arms.

EXAM:
MRI HEAD WITHOUT CONTRAST
TECHNIQUE: Multiplanar, multiecho pulse sequences of the brain and surrounding
structures were obtained without intravenous contrast.

[Series 52: nqsegcb_sc_cor · 1.00mm/px · 18 of 255 slices shown]
[im 1/255]
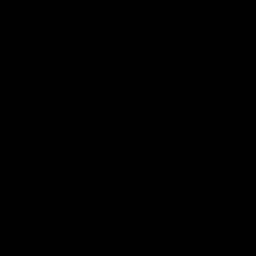
[im 15/255]
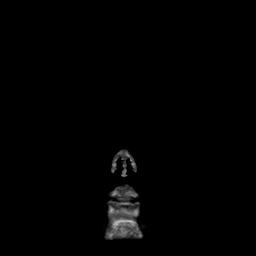
[im 30/255]
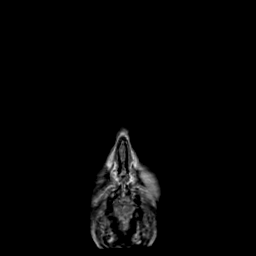
[im 45/255]
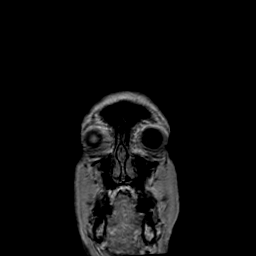
[im 60/255]
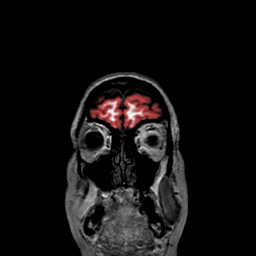
[im 75/255]
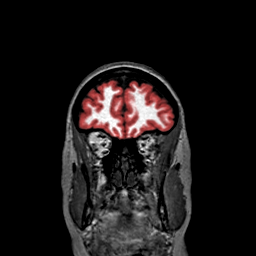
[im 90/255]
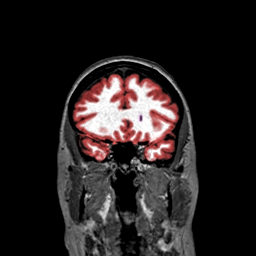
[im 105/255]
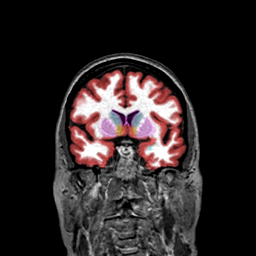
[im 120/255]
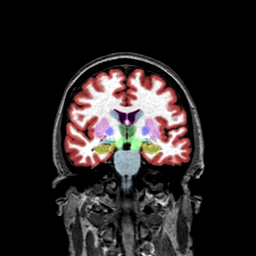
[im 135/255]
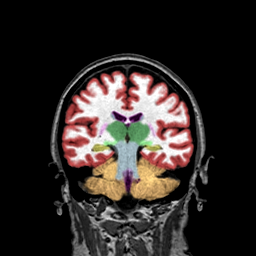
[im 150/255]
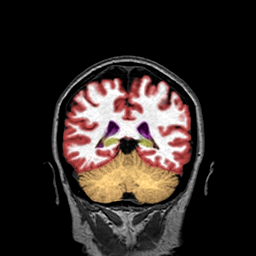
[im 165/255]
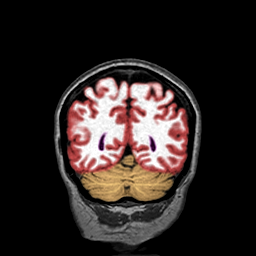
[im 180/255]
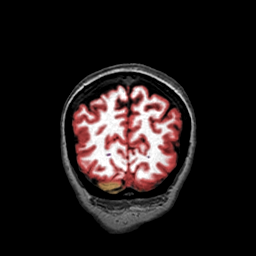
[im 195/255]
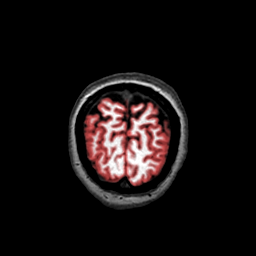
[im 210/255]
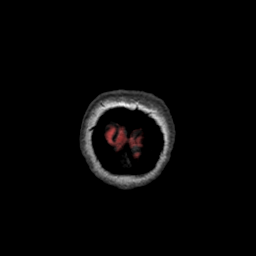
[im 225/255]
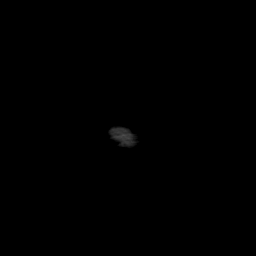
[im 240/255]
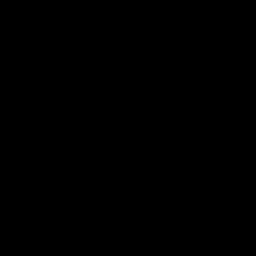
[im 255/255]
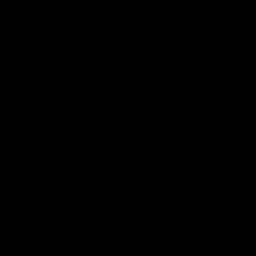

[Series 53: nqsegcb_sc_axl · 1.00mm/px · 16 of 233 slices shown]
[im 1/233]
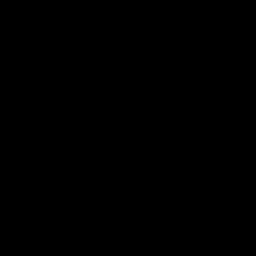
[im 16/233]
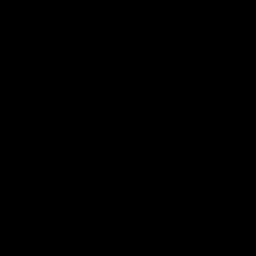
[im 31/233]
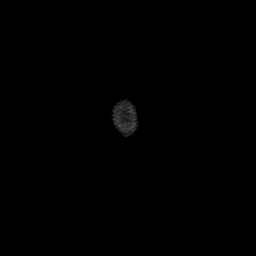
[im 47/233]
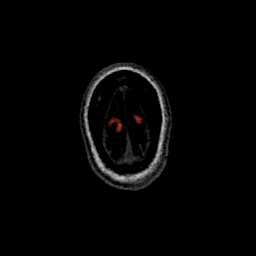
[im 62/233]
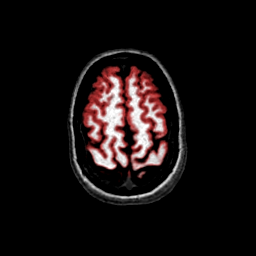
[im 78/233]
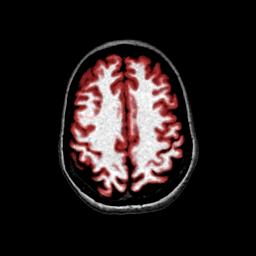
[im 93/233]
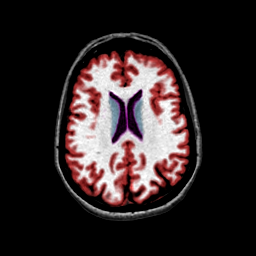
[im 109/233]
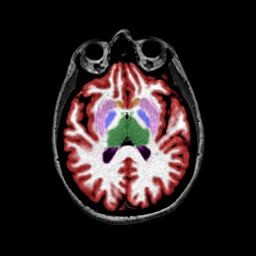
[im 124/233]
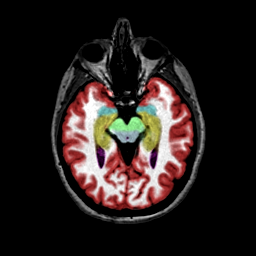
[im 140/233]
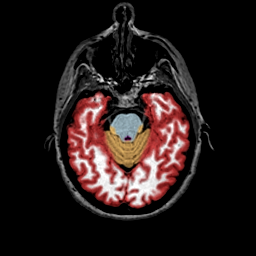
[im 155/233]
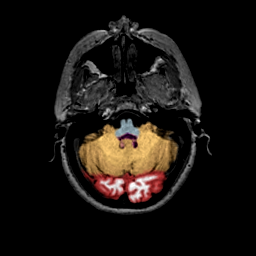
[im 171/233]
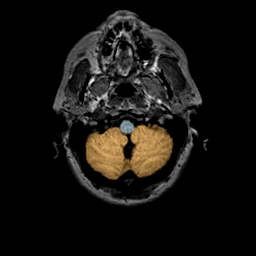
[im 186/233]
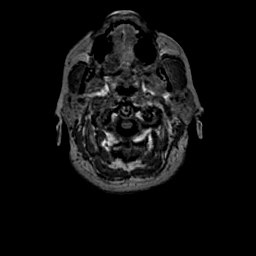
[im 202/233]
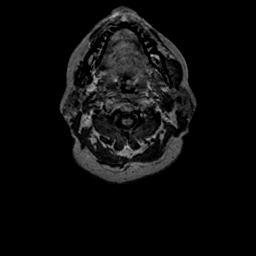
[im 217/233]
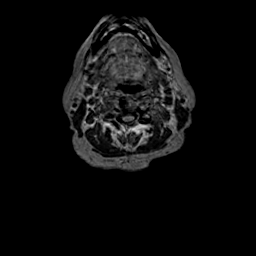
[im 233/233]
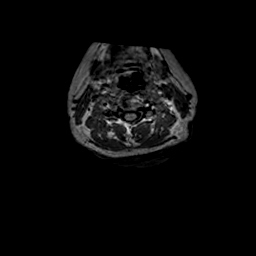

[Series 54: nqsegcb_sc_sag · 1.00mm/px · 14 of 204 slices shown]
[im 1/204]
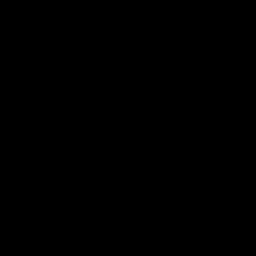
[im 16/204]
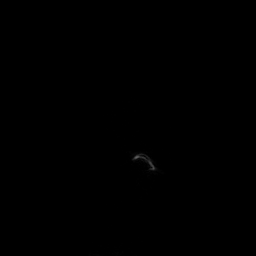
[im 32/204]
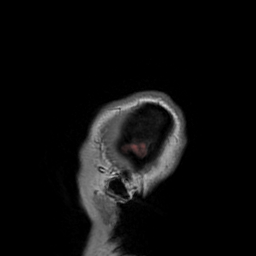
[im 47/204]
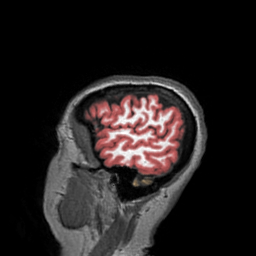
[im 63/204]
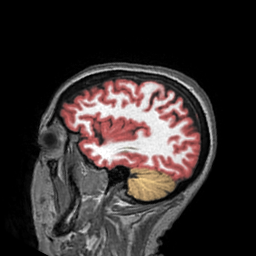
[im 79/204]
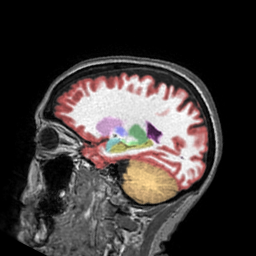
[im 94/204]
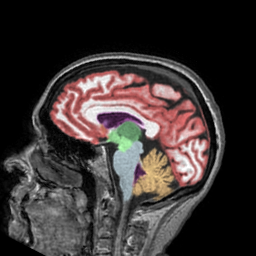
[im 110/204]
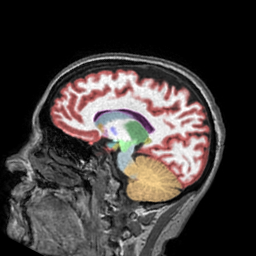
[im 125/204]
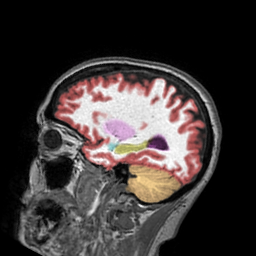
[im 141/204]
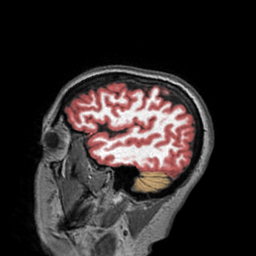
[im 157/204]
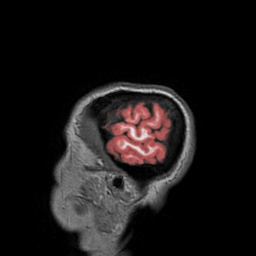
[im 172/204]
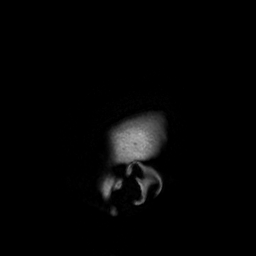
[im 188/204]
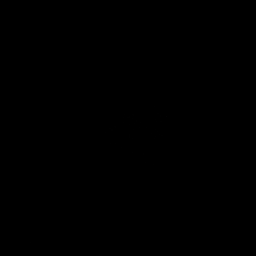
[im 204/204]
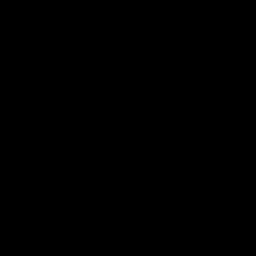

[48 of 48 positions shown; findings below may reference images not displayed]

FINDINGS: Brain: No restricted diffusion to suggest acute infarction. No
midline shift, mass effect, evidence of mass lesion,
ventriculomegaly, extra-axial collection or acute intracranial
hemorrhage. Cervicomedullary junction and pituitary are within
normal limits. Gray and white matter signal is within normal limits
throughout the brain. No encephalomalacia or chronic cerebral blood
products identified.

Vascular: Major intracranial vascular flow voids are preserved and
appear normal.

Skull and upper cervical spine: Negative visible cervical spine.
Visualized bone marrow signal is within normal limits.

Sinuses/Orbits: Orbits soft tissues appear normal. Paranasal sinuses
are clear.

Other: Visible internal auditory structures appear normal. Mastoid
air cells are clear. Visible scalp and face soft tissues appear
negative.

NeuroQuant volumetric analysis with age matched reference was
performed, see details on [HOSPITAL] PACS.

Briefly, brain volume appears maintained.
IMPRESSION: 1. Normal noncontrast MRI appearance of the brain.
2. NeuroQuant volumetric analysis was performed, see details on
[HOSPITAL] PACS.
